# Patient Record
Sex: Female | Born: 1975 | Race: White | Hispanic: No | Marital: Married | Smoking: Former smoker
Health system: Southern US, Community
[De-identification: ages and names within clinical notes are randomized; demographics above are authoritative.]

## PROBLEM LIST (undated history)

## (undated) DIAGNOSIS — J45909 Unspecified asthma, uncomplicated: Secondary | ICD-10-CM

## (undated) DIAGNOSIS — E669 Obesity, unspecified: Secondary | ICD-10-CM

## (undated) DIAGNOSIS — I10 Essential (primary) hypertension: Secondary | ICD-10-CM

## (undated) DIAGNOSIS — F419 Anxiety disorder, unspecified: Secondary | ICD-10-CM

## (undated) DIAGNOSIS — J302 Other seasonal allergic rhinitis: Secondary | ICD-10-CM

## (undated) DIAGNOSIS — E119 Type 2 diabetes mellitus without complications: Secondary | ICD-10-CM

## (undated) DIAGNOSIS — K603 Anal fistula: Secondary | ICD-10-CM

## (undated) DIAGNOSIS — F319 Bipolar disorder, unspecified: Secondary | ICD-10-CM

## (undated) DIAGNOSIS — K649 Unspecified hemorrhoids: Secondary | ICD-10-CM

## (undated) DIAGNOSIS — M549 Dorsalgia, unspecified: Secondary | ICD-10-CM

## (undated) DIAGNOSIS — G8929 Other chronic pain: Secondary | ICD-10-CM

## (undated) DIAGNOSIS — M7711 Lateral epicondylitis, right elbow: Secondary | ICD-10-CM

## (undated) DIAGNOSIS — E559 Vitamin D deficiency, unspecified: Secondary | ICD-10-CM

## (undated) HISTORY — PX: WISDOM TOOTH EXTRACTION: SHX21

---

## 2016-12-07 DIAGNOSIS — E119 Type 2 diabetes mellitus without complications: Secondary | ICD-10-CM | POA: Diagnosis not present

## 2016-12-07 DIAGNOSIS — Z7984 Long term (current) use of oral hypoglycemic drugs: Secondary | ICD-10-CM | POA: Diagnosis not present

## 2017-06-25 DIAGNOSIS — IMO0001 Reserved for inherently not codable concepts without codable children: Secondary | ICD-10-CM | POA: Insufficient documentation

## 2017-06-25 DIAGNOSIS — I10 Essential (primary) hypertension: Secondary | ICD-10-CM | POA: Insufficient documentation

## 2017-06-25 DIAGNOSIS — E7800 Pure hypercholesterolemia, unspecified: Secondary | ICD-10-CM | POA: Insufficient documentation

## 2017-06-25 DIAGNOSIS — E559 Vitamin D deficiency, unspecified: Secondary | ICD-10-CM | POA: Insufficient documentation

## 2017-06-25 DIAGNOSIS — E1165 Type 2 diabetes mellitus with hyperglycemia: Secondary | ICD-10-CM | POA: Insufficient documentation

## 2017-10-29 DIAGNOSIS — I10 Essential (primary) hypertension: Secondary | ICD-10-CM | POA: Diagnosis not present

## 2017-10-29 DIAGNOSIS — N926 Irregular menstruation, unspecified: Secondary | ICD-10-CM | POA: Diagnosis not present

## 2017-10-29 DIAGNOSIS — E119 Type 2 diabetes mellitus without complications: Secondary | ICD-10-CM | POA: Diagnosis not present

## 2017-10-29 DIAGNOSIS — Z23 Encounter for immunization: Secondary | ICD-10-CM | POA: Diagnosis not present

## 2017-11-03 DIAGNOSIS — T881XXA Other complications following immunization, not elsewhere classified, initial encounter: Secondary | ICD-10-CM | POA: Diagnosis not present

## 2017-11-03 DIAGNOSIS — M79622 Pain in left upper arm: Secondary | ICD-10-CM | POA: Diagnosis not present

## 2017-11-03 DIAGNOSIS — I10 Essential (primary) hypertension: Secondary | ICD-10-CM | POA: Diagnosis not present

## 2017-11-09 HISTORY — PX: FISTULOTOMY: SHX6413

## 2017-11-13 DIAGNOSIS — K603 Anal fistula: Secondary | ICD-10-CM | POA: Diagnosis not present

## 2017-11-15 ENCOUNTER — Encounter (HOSPITAL_BASED_OUTPATIENT_CLINIC_OR_DEPARTMENT_OTHER): Payer: Self-pay | Admitting: *Deleted

## 2017-11-15 ENCOUNTER — Ambulatory Visit: Payer: Self-pay | Admitting: General Surgery

## 2017-11-15 ENCOUNTER — Other Ambulatory Visit: Payer: Self-pay

## 2017-11-15 NOTE — Progress Notes (Signed)
Npo after midnight arrive 630am 11-28-17 [facility] take buspirone, concerta, celexa, hydroxazine, ventolina inhaler prn and bring inhaler, spouse brad driver needs urine pregnancy, spoke with dr Molly Madurorobert fitzgerald anesthesia and patient needs to come in for airway assessment due to bmi greater than 45, patient made appointment for labs and ekg 11-23-17 0900 am.

## 2017-11-23 ENCOUNTER — Encounter (HOSPITAL_COMMUNITY)
Admission: RE | Admit: 2017-11-23 | Discharge: 2017-11-23 | Disposition: A | Discharge status: Home / Self Care | Payer: 59 | Source: Ambulatory Visit | Attending: General Surgery | Admitting: General Surgery

## 2017-11-23 ENCOUNTER — Other Ambulatory Visit: Payer: Self-pay

## 2017-11-23 DIAGNOSIS — Z01818 Encounter for other preprocedural examination: Secondary | ICD-10-CM | POA: Insufficient documentation

## 2017-11-23 DIAGNOSIS — I10 Essential (primary) hypertension: Secondary | ICD-10-CM | POA: Diagnosis not present

## 2017-11-23 DIAGNOSIS — E119 Type 2 diabetes mellitus without complications: Secondary | ICD-10-CM | POA: Diagnosis not present

## 2017-11-23 DIAGNOSIS — R9431 Abnormal electrocardiogram [ECG] [EKG]: Secondary | ICD-10-CM | POA: Diagnosis not present

## 2017-11-23 LAB — BASIC METABOLIC PANEL
Anion gap: 9 (ref 5–15)
BUN: 21 mg/dL — AB (ref 6–20)
CALCIUM: 8.7 mg/dL — AB (ref 8.9–10.3)
CO2: 24 mmol/L (ref 22–32)
Chloride: 104 mmol/L (ref 101–111)
Creatinine, Ser: 0.71 mg/dL (ref 0.44–1.00)
GFR calc Af Amer: >60 mL/min (ref >60)
GLUCOSE: 210 mg/dL — AB (ref 65–99)
POTASSIUM: 4.3 mmol/L (ref 3.5–5.1)
Sodium: 137 mmol/L (ref 135–145)

## 2017-11-23 LAB — CBC
HCT: 40.2 % (ref 36.0–46.0)
Hemoglobin: 13.3 g/dL (ref 12.0–15.0)
MCH: 29.4 pg (ref 26.0–34.0)
MCHC: 33.1 g/dL (ref 30.0–36.0)
MCV: 88.7 fL (ref 78.0–100.0)
PLATELETS: 261 10*3/uL (ref 150–400)
RBC: 4.53 MIL/uL (ref 3.87–5.11)
RDW: 14.2 % (ref 11.5–15.5)
WBC: 7.9 10*3/uL (ref 4.0–10.5)

## 2017-11-28 ENCOUNTER — Ambulatory Visit (HOSPITAL_BASED_OUTPATIENT_CLINIC_OR_DEPARTMENT_OTHER): Payer: 59 | Admitting: Anesthesiology

## 2017-11-28 ENCOUNTER — Ambulatory Visit (HOSPITAL_BASED_OUTPATIENT_CLINIC_OR_DEPARTMENT_OTHER)
Admission: RE | Admit: 2017-11-28 | Discharge: 2017-11-28 | Disposition: A | Discharge status: Home / Self Care | Payer: 59 | Source: Ambulatory Visit | Attending: General Surgery | Admitting: General Surgery

## 2017-11-28 ENCOUNTER — Encounter (HOSPITAL_BASED_OUTPATIENT_CLINIC_OR_DEPARTMENT_OTHER)
Admission: RE | Disposition: A | Discharge status: Home / Self Care | Payer: Self-pay | Source: Ambulatory Visit | Attending: General Surgery

## 2017-11-28 ENCOUNTER — Encounter (HOSPITAL_BASED_OUTPATIENT_CLINIC_OR_DEPARTMENT_OTHER): Payer: Self-pay

## 2017-11-28 DIAGNOSIS — K603 Anal fistula: Secondary | ICD-10-CM | POA: Insufficient documentation

## 2017-11-28 DIAGNOSIS — J45909 Unspecified asthma, uncomplicated: Secondary | ICD-10-CM | POA: Diagnosis not present

## 2017-11-28 DIAGNOSIS — I10 Essential (primary) hypertension: Secondary | ICD-10-CM | POA: Insufficient documentation

## 2017-11-28 DIAGNOSIS — E119 Type 2 diabetes mellitus without complications: Secondary | ICD-10-CM | POA: Insufficient documentation

## 2017-11-28 DIAGNOSIS — F319 Bipolar disorder, unspecified: Secondary | ICD-10-CM | POA: Diagnosis not present

## 2017-11-28 DIAGNOSIS — E7800 Pure hypercholesterolemia, unspecified: Secondary | ICD-10-CM | POA: Diagnosis not present

## 2017-11-28 DIAGNOSIS — Z7984 Long term (current) use of oral hypoglycemic drugs: Secondary | ICD-10-CM | POA: Insufficient documentation

## 2017-11-28 DIAGNOSIS — F419 Anxiety disorder, unspecified: Secondary | ICD-10-CM | POA: Diagnosis not present

## 2017-11-28 DIAGNOSIS — M549 Dorsalgia, unspecified: Secondary | ICD-10-CM | POA: Diagnosis not present

## 2017-11-28 DIAGNOSIS — E669 Obesity, unspecified: Secondary | ICD-10-CM | POA: Insufficient documentation

## 2017-11-28 DIAGNOSIS — Z87891 Personal history of nicotine dependence: Secondary | ICD-10-CM | POA: Diagnosis not present

## 2017-11-28 DIAGNOSIS — K649 Unspecified hemorrhoids: Secondary | ICD-10-CM | POA: Diagnosis not present

## 2017-11-28 DIAGNOSIS — Z79899 Other long term (current) drug therapy: Secondary | ICD-10-CM | POA: Diagnosis not present

## 2017-11-28 DIAGNOSIS — Z6841 Body Mass Index (BMI) 40.0 and over, adult: Secondary | ICD-10-CM | POA: Diagnosis not present

## 2017-11-28 HISTORY — DX: Essential (primary) hypertension: I10

## 2017-11-28 HISTORY — DX: Other chronic pain: G89.29

## 2017-11-28 HISTORY — DX: Anxiety disorder, unspecified: F41.9

## 2017-11-28 HISTORY — DX: Type 2 diabetes mellitus without complications: E11.9

## 2017-11-28 HISTORY — DX: Unspecified asthma, uncomplicated: J45.909

## 2017-11-28 HISTORY — DX: Dorsalgia, unspecified: M54.9

## 2017-11-28 HISTORY — DX: Bipolar disorder, unspecified: F31.9

## 2017-11-28 LAB — GLUCOSE, CAPILLARY
GLUCOSE-CAPILLARY: 201 mg/dL — AB (ref 65–99)
Glucose-Capillary: 175 mg/dL — ABNORMAL HIGH (ref 65–99)

## 2017-11-28 LAB — POCT PREGNANCY, URINE: PREG TEST UR: NEGATIVE

## 2017-11-28 SURGERY — EXAM UNDER ANESTHESIA
Anesthesia: Monitor Anesthesia Care | Site: Rectum

## 2017-11-28 MED ORDER — ONDANSETRON HCL 4 MG/2ML IJ SOLN
INTRAMUSCULAR | Status: DC | PRN
  Administered 2017-11-28: 4 mg via INTRAVENOUS

## 2017-11-28 MED ORDER — ONDANSETRON HCL 4 MG/2ML IJ SOLN
INTRAMUSCULAR | Status: AC
  Filled 2017-11-28: qty 2

## 2017-11-28 MED ORDER — PROPOFOL 10 MG/ML IV BOLUS
INTRAVENOUS | Status: AC
  Filled 2017-11-28: qty 20

## 2017-11-28 MED ORDER — DEXAMETHASONE SODIUM PHOSPHATE 10 MG/ML IJ SOLN
INTRAMUSCULAR | Status: AC
  Filled 2017-11-28: qty 1

## 2017-11-28 MED ORDER — ACETAMINOPHEN 325 MG PO TABS
650.0000 mg | ORAL_TABLET | ORAL | Status: DC | PRN
  Filled 2017-11-28: qty 2

## 2017-11-28 MED ORDER — LIDOCAINE 2% (20 MG/ML) 5 ML SYRINGE
INTRAMUSCULAR | Status: AC
  Filled 2017-11-28: qty 5

## 2017-11-28 MED ORDER — CELECOXIB 200 MG PO CAPS
ORAL_CAPSULE | ORAL | Status: AC
  Filled 2017-11-28: qty 1

## 2017-11-28 MED ORDER — OXYCODONE HCL 5 MG PO TABS
ORAL_TABLET | ORAL | Status: AC
  Filled 2017-11-28: qty 1

## 2017-11-28 MED ORDER — PROMETHAZINE HCL 25 MG/ML IJ SOLN
6.2500 mg | INTRAMUSCULAR | Status: DC | PRN
  Filled 2017-11-28: qty 1

## 2017-11-28 MED ORDER — ACETAMINOPHEN 650 MG RE SUPP
650.0000 mg | RECTAL | Status: DC | PRN
  Filled 2017-11-28: qty 1

## 2017-11-28 MED ORDER — SODIUM CHLORIDE 0.9% FLUSH
3.0000 mL | INTRAVENOUS | Status: DC | PRN
  Filled 2017-11-28: qty 3

## 2017-11-28 MED ORDER — MIDAZOLAM HCL 2 MG/2ML IJ SOLN
INTRAMUSCULAR | Status: AC
  Filled 2017-11-28: qty 2

## 2017-11-28 MED ORDER — SODIUM CHLORIDE 0.9% FLUSH
3.0000 mL | Freq: Two times a day (BID) | INTRAVENOUS | Status: DC
  Filled 2017-11-28: qty 3

## 2017-11-28 MED ORDER — DEXAMETHASONE SODIUM PHOSPHATE 4 MG/ML IJ SOLN
INTRAMUSCULAR | Status: DC | PRN
  Administered 2017-11-28: 5 mg via INTRAVENOUS

## 2017-11-28 MED ORDER — ACETAMINOPHEN 500 MG PO TABS
ORAL_TABLET | ORAL | Status: AC
  Filled 2017-11-28: qty 2

## 2017-11-28 MED ORDER — MEPERIDINE HCL 25 MG/ML IJ SOLN
6.2500 mg | INTRAMUSCULAR | Status: DC | PRN
  Filled 2017-11-28: qty 1

## 2017-11-28 MED ORDER — GABAPENTIN 300 MG PO CAPS
ORAL_CAPSULE | ORAL | Status: AC
  Filled 2017-11-28: qty 1

## 2017-11-28 MED ORDER — FENTANYL CITRATE (PF) 100 MCG/2ML IJ SOLN
25.0000 ug | INTRAMUSCULAR | Status: DC | PRN
  Filled 2017-11-28: qty 1

## 2017-11-28 MED ORDER — BUPIVACAINE-EPINEPHRINE 0.5% -1:200000 IJ SOLN
INTRAMUSCULAR | Status: DC | PRN
  Administered 2017-11-28: 30 mL

## 2017-11-28 MED ORDER — SODIUM CHLORIDE 0.9 % IV SOLN
250.0000 mL | INTRAVENOUS | Status: DC | PRN
  Filled 2017-11-28: qty 250

## 2017-11-28 MED ORDER — PROPOFOL 10 MG/ML IV BOLUS
INTRAVENOUS | Status: DC | PRN
  Administered 2017-11-28: 20 mg via INTRAVENOUS

## 2017-11-28 MED ORDER — LACTATED RINGERS IV SOLN
INTRAVENOUS | Status: DC
  Filled 2017-11-28: qty 1000

## 2017-11-28 MED ORDER — KETAMINE HCL 50 MG/ML IJ SOLN
INTRAMUSCULAR | Status: DC | PRN
  Administered 2017-11-28 (×5): 10 mg via INTRAMUSCULAR

## 2017-11-28 MED ORDER — KETAMINE HCL 10 MG/ML IJ SOLN
INTRAMUSCULAR | Status: AC
  Filled 2017-11-28: qty 1

## 2017-11-28 MED ORDER — PROPOFOL 500 MG/50ML IV EMUL
INTRAVENOUS | Status: DC | PRN
  Administered 2017-11-28: 200 ug/kg/min via INTRAVENOUS

## 2017-11-28 MED ORDER — HYDROCODONE-ACETAMINOPHEN 5-325 MG PO TABS: 1.0000 | ORAL_TABLET | ORAL | 0 refills | Status: DC | PRN

## 2017-11-28 MED ORDER — FENTANYL CITRATE (PF) 100 MCG/2ML IJ SOLN
INTRAMUSCULAR | Status: AC
  Filled 2017-11-28: qty 2

## 2017-11-28 MED ORDER — LIDOCAINE HCL (CARDIAC) 20 MG/ML IV SOLN
INTRAVENOUS | Status: DC | PRN
  Administered 2017-11-28: 100 mg via INTRAVENOUS
  Administered 2017-11-28: 1 mg via INTRAVENOUS

## 2017-11-28 MED ORDER — CELECOXIB 200 MG PO CAPS
200.0000 mg | ORAL_CAPSULE | ORAL | Status: AC
  Administered 2017-11-28: 200 mg via ORAL
  Filled 2017-11-28: qty 1

## 2017-11-28 MED ORDER — MIDAZOLAM HCL 5 MG/5ML IJ SOLN
INTRAMUSCULAR | Status: DC | PRN
  Administered 2017-11-28: 2 mg via INTRAVENOUS

## 2017-11-28 MED ORDER — ARTIFICIAL TEARS OPHTHALMIC OINT
TOPICAL_OINTMENT | OPHTHALMIC | Status: AC
  Filled 2017-11-28: qty 3.5

## 2017-11-28 MED ORDER — OXYCODONE HCL 5 MG PO TABS
5.0000 mg | ORAL_TABLET | Freq: Once | ORAL | Status: AC | PRN
  Administered 2017-11-28: 5 mg via ORAL
  Filled 2017-11-28: qty 1

## 2017-11-28 MED ORDER — ACETAMINOPHEN 500 MG PO TABS
1000.0000 mg | ORAL_TABLET | ORAL | Status: AC
  Administered 2017-11-28: 1000 mg via ORAL
  Filled 2017-11-28: qty 2

## 2017-11-28 MED ORDER — LACTATED RINGERS IV SOLN
INTRAVENOUS | Status: DC
  Administered 2017-11-28: 07:00:00 via INTRAVENOUS
  Administered 2017-11-28: 1000 mL via INTRAVENOUS
  Filled 2017-11-28: qty 1000

## 2017-11-28 MED ORDER — PROPOFOL 500 MG/50ML IV EMUL
INTRAVENOUS | Status: AC
  Filled 2017-11-28: qty 50

## 2017-11-28 MED ORDER — OXYCODONE HCL 5 MG PO TABS
5.0000 mg | ORAL_TABLET | ORAL | Status: DC | PRN
  Filled 2017-11-28: qty 2

## 2017-11-28 MED ORDER — GABAPENTIN 300 MG PO CAPS
300.0000 mg | ORAL_CAPSULE | ORAL | Status: AC
  Administered 2017-11-28: 300 mg via ORAL
  Filled 2017-11-28: qty 1

## 2017-11-28 MED ORDER — SUCCINYLCHOLINE CHLORIDE 200 MG/10ML IV SOSY
PREFILLED_SYRINGE | INTRAVENOUS | Status: AC
  Filled 2017-11-28: qty 10

## 2017-11-28 MED ORDER — OXYCODONE HCL 5 MG/5ML PO SOLN
5.0000 mg | Freq: Once | ORAL | Status: AC | PRN
  Filled 2017-11-28: qty 5

## 2017-11-28 SURGICAL SUPPLY — 56 items
BENZOIN TINCTURE PRP APPL 2/3 (GAUZE/BANDAGES/DRESSINGS) ×6 IMPLANT
BLADE EXTENDED COATED 6.5IN (ELECTRODE) IMPLANT
BLADE HEX COATED 2.75 (ELECTRODE) ×3 IMPLANT
BLADE SURG 10 STRL SS (BLADE) IMPLANT
BLADE SURG 15 STRL LF DISP TIS (BLADE) IMPLANT
BLADE SURG 15 STRL SS (BLADE)
BRIEF STRETCH FOR OB PAD LRG (UNDERPADS AND DIAPERS) ×3 IMPLANT
CANISTER SUCT 3000ML PPV (MISCELLANEOUS) ×3 IMPLANT
COVER BACK TABLE 60X90IN (DRAPES) ×3 IMPLANT
COVER MAYO STAND STRL (DRAPES) ×3 IMPLANT
DECANTER SPIKE VIAL GLASS SM (MISCELLANEOUS) ×3 IMPLANT
DRAPE LAPAROTOMY 100X72 PEDS (DRAPES) ×3 IMPLANT
DRAPE UTILITY XL STRL (DRAPES) ×3 IMPLANT
ELECT REM PT RETURN 9FT ADLT (ELECTROSURGICAL) ×3
ELECTRODE REM PT RTRN 9FT ADLT (ELECTROSURGICAL) ×2 IMPLANT
GAUZE SPONGE 4X4 12PLY STRL (GAUZE/BANDAGES/DRESSINGS) ×3 IMPLANT
GAUZE SPONGE 4X4 16PLY XRAY LF (GAUZE/BANDAGES/DRESSINGS) IMPLANT
GLOVE BIO SURGEON STRL SZ 6.5 (GLOVE) ×3 IMPLANT
GLOVE BIOGEL PI IND STRL 7.0 (GLOVE) ×2 IMPLANT
GLOVE BIOGEL PI IND STRL 7.5 (GLOVE) ×2 IMPLANT
GLOVE BIOGEL PI IND STRL 8.5 (GLOVE) ×2 IMPLANT
GLOVE BIOGEL PI INDICATOR 7.0 (GLOVE) ×1
GLOVE BIOGEL PI INDICATOR 7.5 (GLOVE) ×1
GLOVE BIOGEL PI INDICATOR 8.5 (GLOVE) ×1
GLOVE INDICATOR 7.0 STRL GRN (GLOVE) ×3 IMPLANT
GLOVE SURG SS PI 8.5 STRL IVOR (GLOVE) ×1
GLOVE SURG SS PI 8.5 STRL STRW (GLOVE) ×2 IMPLANT
GOWN SPEC L3 XXLG W/TWL (GOWN DISPOSABLE) ×3 IMPLANT
GOWN STRL REUS W/TWL 2XL LVL3 (GOWN DISPOSABLE) ×3 IMPLANT
HYDROGEN PEROXIDE 16OZ (MISCELLANEOUS) ×3 IMPLANT
KIT RM TURNOVER CYSTO AR (KITS) ×3 IMPLANT
LOOP VESSEL MAXI BLUE (MISCELLANEOUS) ×3 IMPLANT
NEEDLE HYPO 22GX1.5 SAFETY (NEEDLE) ×3 IMPLANT
NS IRRIG 500ML POUR BTL (IV SOLUTION) ×3 IMPLANT
PACK BASIN DAY SURGERY FS (CUSTOM PROCEDURE TRAY) ×3 IMPLANT
PAD ABD 8X10 STRL (GAUZE/BANDAGES/DRESSINGS) ×3 IMPLANT
PAD ARMBOARD 7.5X6 YLW CONV (MISCELLANEOUS) ×3 IMPLANT
PENCIL BUTTON HOLSTER BLD 10FT (ELECTRODE) ×3 IMPLANT
SPONGE GAUZE 4X4 12PLY STER LF (GAUZE/BANDAGES/DRESSINGS) IMPLANT
SPONGE HEMORRHOID 8X3CM (HEMOSTASIS) IMPLANT
SPONGE SURGIFOAM ABS GEL 12-7 (HEMOSTASIS) IMPLANT
SUCTION FRAZIER HANDLE 10FR (MISCELLANEOUS)
SUCTION TUBE FRAZIER 10FR DISP (MISCELLANEOUS) IMPLANT
SUT CHROMIC 2 0 SH (SUTURE) ×3 IMPLANT
SUT CHROMIC 3 0 SH 27 (SUTURE) ×3 IMPLANT
SUT ETHIBOND 0 (SUTURE) ×3 IMPLANT
SUT VIC AB 2-0 SH 27 (SUTURE)
SUT VIC AB 2-0 SH 27XBRD (SUTURE) IMPLANT
SUT VIC AB 3-0 SH 18 (SUTURE) IMPLANT
SUT VIC AB 4-0 P-3 18XBRD (SUTURE) IMPLANT
SUT VIC AB 4-0 P3 18 (SUTURE)
SYR CONTROL 10ML LL (SYRINGE) ×3 IMPLANT
TOWEL OR 17X24 6PK STRL BLUE (TOWEL DISPOSABLE) ×3 IMPLANT
TRAY DSU PREP LF (CUSTOM PROCEDURE TRAY) ×3 IMPLANT
TUBE CONNECTING 12X1/4 (SUCTIONS) ×3 IMPLANT
YANKAUER SUCT BULB TIP NO VENT (SUCTIONS) ×3 IMPLANT

## 2017-11-28 NOTE — Anesthesia Procedure Notes (Signed)
Procedure Name: [place] Performed by: Effie Berkshire, MD Pre-anesthesia Checklist: Patient identified, Timeout performed, Emergency Drugs available, Suction available and Patient being monitored Placement Confirmation: positive ETCO2,  CO2 detector and breath sounds checked- equal and bilateral

## 2017-11-28 NOTE — Anesthesia Preprocedure Evaluation (Addendum)
Anesthesia Evaluation  Patient identified by MRN, date of birth, ID band Patient awake    Reviewed: Allergy & Precautions, NPO status , Patient's Chart, lab work & pertinent test results  Airway Mallampati: I  TM Distance: >3 FB Neck ROM: Full    Dental  (+) Teeth Intact, Dental Advisory Given   Pulmonary asthma , former smoker,    breath sounds clear to auscultation       Cardiovascular hypertension, Pt. on medications  Rhythm:Regular Rate:Normal     Neuro/Psych PSYCHIATRIC DISORDERS Anxiety Bipolar Disorder    GI/Hepatic negative GI ROS, Neg liver ROS,   Endo/Other  diabetes, Type 2, Oral Hypoglycemic Agents  Renal/GU negative Renal ROS  negative genitourinary   Musculoskeletal negative musculoskeletal ROS (+)   Abdominal (+) + obese,   Peds  Hematology   Anesthesia Other Findings   Reproductive/Obstetrics                            Lab Results  Component Value Date   WBC 7.9 11/23/2017   HGB 13.3 11/23/2017   HCT 40.2 11/23/2017   MCV 88.7 11/23/2017   PLT 261 11/23/2017   Lab Results  Component Value Date   CREATININE 0.71 11/23/2017   BUN 21 (H) 11/23/2017   NA 137 11/23/2017   K 4.3 11/23/2017   CL 104 11/23/2017   CO2 24 11/23/2017   No results found for: INR, PROTIME  EKG: normal sinus rhythm.  Anesthesia Physical Anesthesia Plan  ASA: III  Anesthesia Plan: MAC   Post-op Pain Management:    Induction: Intravenous  PONV Risk Score and Plan: 3 and Ondansetron, Propofol infusion and Midazolam  Airway Management Planned: Natural Airway and Simple Face Mask  Additional Equipment: None  Intra-op Plan:   Post-operative Plan:   Informed Consent: I have reviewed the patients History and Physical, chart, labs and discussed the procedure including the risks, benefits and alternatives for the proposed anesthesia with the patient or authorized representative who has  indicated his/her understanding and acceptance.   Dental advisory given  Plan Discussed with: CRNA  Anesthesia Plan Comments:         Anesthesia Quick Evaluation

## 2017-11-28 NOTE — Transfer of Care (Signed)
  Last Vitals:  Vitals:   11/28/17 0913 11/28/17 0915  BP: 126/82 124/78  Pulse: 88 85  Resp: 12 12  Temp: 36.9 C   SpO2: 99% 100%    Last Pain:  Vitals:   11/28/17 0646  TempSrc:   PainSc: 6       Patients Stated Pain Goal: 5 (11/28/17 1747)  Immediate Anesthesia Transfer of Care Note  Patient: Tanya Parker  Procedure(s) Performed: Procedure(s) (LRB): ANAL EXAM UNDER ANESTHESIA WITH SETON placement (N/A)  Patient Location: PACU  Anesthesia Type: MAC  Level of Consciousness: awake, alert  and oriented  Airway & Oxygen Therapy: Patient Spontanous Breathing and Patient connected to oxygen  Post-op Assessment: Report given to PACU RN and Post -op Vital signs reviewed and stable  Post vital signs: Reviewed and stable  Complications: No apparent anesthesia complications

## 2017-11-28 NOTE — Op Note (Addendum)
11/28/2017  9:06 AM  PATIENT:  Tanya Parker  42 y.o. female  Patient Care Team: London Pepper, MD as PCP - General (Family Medicine)  PRE-OPERATIVE DIAGNOSIS:  anal fistula  POST-OPERATIVE DIAGNOSIS:  anal fistula  PROCEDURE:  ANAL EXAM UNDER ANESTHESIA WITH SETON PLACEMENT   Surgeon(s): Leighton Ruff, MD  ASSISTANT: none   ANESTHESIA:   local and MAC  SPECIMEN:  No Specimen  DISPOSITION OF SPECIMEN:  N/A  COUNTS:  YES  PLAN OF CARE: Discharge to home after PACU  PATIENT DISPOSITION:  PACU - hemodynamically stable.  INDICATION: 42 year old female who presented to the office with complaints of a perianal abscess and chronic perianal drainage.   OR FINDINGS: Right anterior fistula with external sphincter involvement  DESCRIPTION: the patient was identified in the preoperative holding area and taken to the OR where they were laid on the operating room table.  MAC anesthesia was induced without difficulty. The patient was then positioned in prone jackknife position with buttocks gently taped apart.  The patient was then prepped and draped in usual sterile fashion.  SCDs were noted to be in place prior to the initiation of anesthesia. A surgical timeout was performed indicating the correct patient, procedure, positioning and need for preoperative antibiotics.  A rectal block was performed using Marcaine with epinephrine.    I began with a digital rectal exam.  There were no masses.  Sphincter tone was mildly decreased.  I then placed a Hill-Ferguson anoscope into the anal canal and evaluated this completely.  There was an obvious anterior midline internal opening distal to the dentate line.  I divided the fistula tract to the level of the external sphincter using Bovie electrocautery.  This was marsupialized with a 2-0 chromic suture.  I then passed an Ethibond suture through the fistula tract and brought a vessel loop back through to create a seton.  I used Ethibond suture to  secure the seton into a loop.  Additional local anesthesia was given.  A dressing was applied.  The patient was awakened from anesthesia and sent to the postanesthesia care unit in stable condition.  I have reviewed the [employer].

## 2017-11-28 NOTE — Discharge Instructions (Addendum)
ANORECTAL SURGERY: POST OP INSTRUCTIONS 1. Take your usually prescribed home medications unless otherwise directed. 2. DIET: During the first few hours after surgery sip on some liquids until you are able to urinate.  It is normal to not urinate for several hours after this surgery.  If you feel uncomfortable, please contact the office for instructions.  After you are able to urinate,you may eat, if you feel like it.  Follow a light bland diet the first 24 hours after arrival home, such as soup, liquids, crackers, etc.  Be sure to include lots of fluids daily (6-8 glasses).  Avoid fast food or heavy meals, as your are more likely to get nauseated.  Eat a low fat diet the next few days after surgery.  Limit caffeine intake to 1-2 servings a day. 3. PAIN CONTROL: a. Pain is best controlled by a usual combination of several different methods TOGETHER: i. Muscle relaxation: Soak in a warm bath (or Sitz bath) three times a day and after bowel movements.  Continue to do this until all pain is resolved. ii. Over the counter pain medication iii. Prescription pain medication b. Most patients will experience some swelling and discomfort in the anus/rectal area and incisions.  Heat such as warm towels, sitz baths, warm baths, etc to help relax tight/sore spots and speed recovery.  Some people prefer to use ice, especially in the first couple days after surgery, as it may decrease the pain and swelling, or alternate between ice & heat.  Experiment to what works for you.  Swelling and bruising can take several weeks to resolve.  Pain can take even longer to completely resolve. c. It is helpful to take an over-the-counter pain medication regularly for the first few weeks.  Choose one of the following that works best for you: i. Naproxen (Aleve, etc)  Two 220mg tabs twice a day ii. Ibuprofen (Advil, etc) Three 200mg tabs four times a day (every meal & bedtime) d. A  prescription for pain medication (such as percocet,  oxycodone, hydrocodone, etc) should be given to you upon discharge.  Take your pain medication as prescribed.  i. If you are having problems/concerns with the prescription medicine (does not control pain, nausea, vomiting, rash, itching, etc), please call us [phone] to see if we need to switch you to a different pain medicine that will work better for you and/or control your side effect better. ii. If you need a refill on your pain medication, please contact your pharmacy.  They will contact our office to request authorization. Prescriptions will not be filled after 5 pm or on week-ends. 4. KEEP YOUR BOWELS REGULAR and AVOID CONSTIPATION a. The goal is one to two soft bowel movements a day.  You should at least have a bowel movement every other day. b. Avoid getting constipated.  Between the surgery and the pain medications, it is common to experience some constipation. This can be very painful after rectal surgery.  Increasing fluid intake and taking a fiber supplement (such as Metamucil, Citrucel, FiberCon, etc) 1-2 times a day regularly will usually help prevent this problem from occurring.  A stool softener like colace is also recommended.  This can be purchased over the counter at your pharmacy.  You can take it up to 3 times a day.  If you do not have a bowel movement after 24 hrs since your surgery, take one does of milk of magnesia.  If you still haven't had a bowel movement 8-12 hours after   that dose, take another dose.  If you don't have a bowel movement 48 hrs after surgery, purchase a Fleets enema from the drug store and administer gently per package instructions.  If you still are having trouble with your bowel movements after that, please call the office for further instructions. c. If you develop diarrhea or have many loose bowel movements, simplify your diet to bland foods & liquids for a few days.  Stop any stool softeners and decrease your fiber supplement.  Switching to mild  anti-diarrheal medications (Kayopectate, Pepto Bismol) can help.  If this worsens or does not improve, please call us.  5. Wound Care a. Remove your bandages before your first bowel movement or 8 hours after surgery.     b. Remove any wound packing material at this tim,e as well.  You do not need to repack the wound unless instructed otherwise.  Wear an absorbent pad or soft cotton gauze in your underwear to catch any drainage and help keep the area clean. You should change this every 2-3 hours while awake. c. Keep the area clean and dry.  Bathe / shower every day, especially after bowel movements.  Keep the area clean by showering / bathing over the incision / wound.   It is okay to soak an open wound to help wash it.  Wet wipes or showers / gentle washing after bowel movements is often less traumatic than regular toilet paper. d. You may have some styrofoam-like soft packing in the rectum which will come out with the first bowel movement.  e. You will often notice bleeding with bowel movements.  This should slow down by the end of the first week of surgery f. Expect some drainage.  This should slow down, too, by the end of the first week of surgery.  Wear an absorbent pad or soft cotton gauze in your underwear until the drainage stops. g. Do Not sit on a rubber or pillow ring.  This can make you symptoms worse.  You may sit on a soft pillow if needed.  6. ACTIVITIES as tolerated:   a. You may resume regular (light) daily activities beginning the next day--such as daily self-care, walking, climbing stairs--gradually increasing activities as tolerated.  If you can walk 30 minutes without difficulty, it is safe to try more intense activity such as jogging, treadmill, bicycling, low-impact aerobics, swimming, etc. b. Save the most intensive and strenuous activity for last such as sit-ups, heavy lifting, contact sports, etc  Refrain from any heavy lifting or straining until you are off narcotics for pain  control.   c. You may drive when you are no longer taking prescription pain medication, you can comfortably sit for long periods of time, and you can safely maneuver your car and apply brakes. d. You may have sexual intercourse when it is comfortable.  7. FOLLOW UP in our office a. Please call [facility] at [phone] to set up an appointment to see your surgeon in the office for a follow-up appointment approximately 3-4 weeks after your surgery. b. Make sure that you call for this appointment the day you arrive home to insure a convenient appointment time. 10. IF YOU HAVE DISABILITY OR FAMILY LEAVE FORMS, BRING THEM TO THE OFFICE FOR PROCESSING.  DO NOT GIVE THEM TO YOUR DOCTOR.     WHEN TO CALL US [phone]: 1. Poor pain control 2. Reactions / problems with new medications (rash/itching, nausea, etc)  3. Fever over 101.5 F (38.5 C) 4.   Inability to urinate 5. Nausea and/or vomiting 6. Worsening swelling or bruising 7. Continued bleeding from incision. 8. Increased pain, redness, or drainage from the incision  The clinic staff is available to answer your questions during regular business hours (8:30am-5pm).  Please don't hesitate to call and ask to speak to one of our nurses for clinical concerns.   A surgeon from [facility] is always on call at the hospitals   If you have a medical emergency, go to the nearest emergency room or call 911.    [facility] [address], [address], [place], [place]  [address] ? MAIN: [phone] ? TOLL FREE: [phone] ? FAX [phone] [email or web address]     Post Anesthesia Home Care Instructions  Activity: Get plenty of rest for the remainder of the day. A responsible individual must stay with you for 24 hours following the procedure.  For the next 24 hours, DO NOT: -Drive a car -Operate machinery -Drink alcoholic beverages -Take any medication unless instructed by your  physician -Make any legal decisions or sign important papers.  Meals: Start with liquid foods such as gelatin or soup. Progress to regular foods as tolerated. Avoid greasy, spicy, heavy foods. If nausea and/or vomiting occur, drink only clear liquids until the nausea and/or vomiting subsides. Call your physician if vomiting continues.  Special Instructions/Symptoms: Your throat may feel dry or sore from the anesthesia or the breathing tube placed in your throat during surgery. If this causes discomfort, gargle with warm salt water. The discomfort should disappear within 24 hours.  If you had a scopolamine patch placed behind your ear for the management of post- operative nausea and/or vomiting:  1. The medication in the patch is effective for 72 hours, after which it should be removed.  Wrap patch in a tissue and discard in the trash. Wash hands thoroughly with soap and water. 2. You may remove the patch earlier than 72 hours if you experience unpleasant side effects which may include dry mouth, dizziness or visual disturbances. 3. Avoid touching the patch. Wash your hands with soap and water after contact with the patch.        

## 2017-11-28 NOTE — Anesthesia Postprocedure Evaluation (Signed)
Anesthesia Post Note  Patient: Tanya Parker  Procedure(s) Performed: ANAL EXAM UNDER ANESTHESIA WITH SETON placement (N/A Rectum)     Patient location during evaluation: PACU Anesthesia Type: MAC Level of consciousness: awake and alert Pain management: pain level controlled Vital Signs Assessment: post-procedure vital signs reviewed and stable Respiratory status: spontaneous breathing, nonlabored ventilation, respiratory function stable and patient connected to nasal cannula oxygen Cardiovascular status: stable and blood pressure returned to baseline Postop Assessment: no apparent nausea or vomiting Anesthetic complications: no    Last Vitals:  Vitals:   11/28/17 0930 11/28/17 0945  BP: 118/72 115/89  Pulse: 75 77  Resp: 13 17  Temp:    SpO2: 98% 100%                  Effie Berkshire

## 2017-11-28 NOTE — H&P (Signed)
History of Present Illness Tanya Levee MD; 11/13/2017 11:24 AM) The patient is a 42 year old female who presents with a complaint of anal problems. 42 year old female who presents to the office for evaluation of an anal fistula. She states approximately 2 years ago she developed a perirectal abscess. This was drained by her primary care physician. Since that time she has had problems with near constant drainage. She complains of occasional pain. She is at 2 vaginal deliveries with some tearing. She denies any fecal incontinence. She does have diabetes but this is relatively well controlled.   Past Surgical History Doristine Devoid, CMA; 11/13/2017 11:13 AM) Oral Surgery   Diagnostic Studies History Doristine Devoid, CMA; 11/13/2017 11:13 AM) Colonoscopy  never Mammogram  never Pap Smear  1-5 years ago  Allergies Doristine Devoid, CMA; 11/13/2017 11:13 AM) No Known Drug Allergies [11/13/2017]:  Medication History Doristine Devoid, CMA; 11/13/2017 11:15 AM) Lisinopril-Hydrochlorothiazide (20-12.5MG  Tablet, Oral) Active. BusPIRone HCl (15MG  Tablet, Oral) Active. Concerta (18MG  Tablet ER, Oral) Active. Invokana (100MG  Tablet, Oral) Active. LaMICtal (150MG  Tablet, Oral) Active. Trileptal (600MG  Tablet, Oral) Active. MetFORMIN HCl (1000MG  Tablet, Oral) Active. Victoza (18MG /3ML Solution, Subcutaneous) Active. HydrOXYzine HCl (25MG  Tablet, Oral) Active. Medications Reconciled  Social History Doristine Devoid, CMA; 11/13/2017 11:13 AM) Alcohol use  Occasional alcohol use. Caffeine use  Carbonated beverages. Illicit drug use  Remotely quit drug use. Tobacco use  Former smoker.  Family History Doristine Devoid, CMA; 11/13/2017 11:13 AM) Alcohol Abuse  Father. Arthritis  Mother. Colon Polyps  Father. Depression  Father, Mother. Diabetes Mellitus  Mother. Hypertension  Father, Mother. Malignant Neoplasm Of Pancreas  Family Members In General. Prostate Cancer   Father.  Pregnancy / Birth History Doristine Devoid, CMA; 11/13/2017 11:13 AM) Age at menarche  11 years. Gravida  3 Maternal age  74-20 Para  2 Regular periods   Other Problems Doristine Devoid, CMA; 11/13/2017 11:13 AM) Anxiety Disorder  Asthma  Back Pain  Depression  Diabetes Mellitus  Hemorrhoids  High blood pressure  Hypercholesterolemia     Review of Systems (Chemira Jones CMA; 11/13/2017 11:13 AM) General Not Present- Appetite Loss, Chills, Fatigue, Fever, Night Sweats, Weight Gain and Weight Loss. Skin Not Present- Change in Wart/Mole, Dryness, Hives, Jaundice, New Lesions, Non-Healing Wounds, Rash and Ulcer. HEENT Present- Seasonal Allergies and Wears glasses/contact lenses. Not Present- Earache, Hearing Loss, Hoarseness, Nose Bleed, Oral Ulcers, Ringing in the Ears, Sinus Pain, Sore Throat, Visual Disturbances and Yellow Eyes. Respiratory Present- Snoring. Not Present- Bloody sputum, Chronic Cough, Difficulty Breathing and Wheezing. Breast Not Present- Breast Mass, Breast Pain, Nipple Discharge and Skin Changes. Cardiovascular Not Present- Chest Pain, Difficulty Breathing Lying Down, Leg Cramps, Palpitations, Rapid Heart Rate, Shortness of Breath and Swelling of Extremities. Gastrointestinal Present- Rectal Pain. Not Present- Abdominal Pain, Bloating, Bloody Stool, Change in Bowel Habits, Chronic diarrhea, Constipation, Difficulty Swallowing, Excessive gas, Gets full quickly at meals, Hemorrhoids, Indigestion, Nausea and Vomiting. Female Genitourinary Not Present- Frequency, Nocturia, Painful Urination, Pelvic Pain and Urgency. Musculoskeletal Present- Back Pain. Not Present- Joint Pain, Joint Stiffness, Muscle Pain, Muscle Weakness and Swelling of Extremities. Neurological Not Present- Decreased Memory, Fainting, Headaches, Numbness, Seizures, Tingling, Tremor, Trouble walking and Weakness. Psychiatric Present- Anxiety, Bipolar and Frequent crying. Not Present- Change in  Sleep Pattern, Depression and Fearful. Endocrine Not Present- Cold Intolerance, Excessive Hunger, Hair Changes, Heat Intolerance, Hot flashes and New Diabetes. Hematology Not Present- Blood Thinners, Easy Bruising, Excessive bleeding, Gland problems, HIV and Persistent Infections.  Vitals Doristine Devoid CMA;  11/13/2017 11:13 AM) 11/13/2017 11:13 AM Weight: 323.4 lb Height: 71in Body Surface Area: 2.59 m Body Mass Index: 45.1 kg/m  Temp.: 97.60F(Oral)  Pulse: 133 (Regular)  BP: 110/80 (Sitting, Left Arm, Standard)       Physical Exam Tanya Parker(Anarely Nicholls MD; 11/13/2017 11:25 AM) General Mental Status-Alert. General Appearance-Not in acute distress. Build & Nutrition-Well nourished. Posture-Normal posture. Gait-Normal.  Head and Neck Head-normocephalic, atraumatic with no lesions or palpable masses. Trachea-midline.  Chest and Lung Exam Chest and lung exam reveals -on auscultation, normal breath sounds, no adventitious sounds and normal vocal resonance.  Cardiovascular Cardiovascular examination reveals -normal heart sounds, regular rate and rhythm with no murmurs and no digital clubbing, cyanosis, edema, increased warmth or tenderness.  Abdomen Inspection Inspection of the abdomen reveals - No Hernias. Palpation/Percussion Palpation and Percussion of the abdomen reveal - Soft, Non Tender, No Rigidity (guarding), No hepatosplenomegaly and No Palpable abdominal masses.  Neurologic Neurologic evaluation reveals -alert and oriented x 3 with no impairment of recent or remote memory, normal attention span and ability to concentrate, normal sensation and normal coordination.  Musculoskeletal Normal Exam - Bilateral-Upper Extremity Strength Normal and Lower Extremity Strength Normal.    Assessment & Plan ANAL FISTULA (K60.3) Impression: 42 year old female who presents to the office with 2 year history of a symptomatic anal fistula. I have recommended  an anal exam under anesthesia. We will plan on performing a fistulotomy versus seton placement depending on the depth of the fistula. We have discussed this in detail including risk of incontinence with fistulotomy and need for additional surgery and risk of recurrence with a two-stage procedure. I believe she understands this and wishes to proceed with surgery.

## 2017-12-20 ENCOUNTER — Ambulatory Visit: Payer: Self-pay | Admitting: General Surgery

## 2017-12-20 DIAGNOSIS — E782 Mixed hyperlipidemia: Secondary | ICD-10-CM | POA: Diagnosis not present

## 2017-12-20 DIAGNOSIS — Z7984 Long term (current) use of oral hypoglycemic drugs: Secondary | ICD-10-CM | POA: Diagnosis not present

## 2017-12-20 DIAGNOSIS — E1140 Type 2 diabetes mellitus with diabetic neuropathy, unspecified: Secondary | ICD-10-CM | POA: Diagnosis not present

## 2017-12-20 DIAGNOSIS — E1165 Type 2 diabetes mellitus with hyperglycemia: Secondary | ICD-10-CM | POA: Diagnosis not present

## 2017-12-20 NOTE — H&P (Signed)
History of Present Illness Tanya Levee MD; 12/20/2017 10:45 AM) The patient is a 42 year old female who presents with a complaint of anal problems. 42 year old female who presented to the office for evaluation of an anal fistula. She underwent a partial fistulotomy on November 28, 2017. Since that time she has had some trouble with postoperative pain especially with bowel movements but otherwise is doing well and back to work.    Problem List/Past Medical Tanya Levee, MD; 12/20/2017 11:03 AM) ANAL FISTULA (K60.3)  Past Surgical History Tanya Levee, MD; 12/20/2017 11:03 AM) Oral Surgery  Diagnostic Studies History Tanya Levee, MD; 12/20/2017 11:03 AM) Colonoscopy never Mammogram never Pap Smear 1-5 years ago  Allergies (Tanya Parker, RMA; 12/20/2017 10:20 AM) No Known Drug Allergies [11/13/2017]: Allergies Reconciled  Medication History (Tanya Parker, RMA; 12/20/2017 10:20 AM) Ondansetron (4MG  Tablet Disint, 1 (one) Oral every six hours, as needed for nausea, Taken starting 11/30/2017) Active. Lisinopril-Hydrochlorothiazide (20-12.5MG  Tablet, Oral) Active. BusPIRone HCl (15MG  Tablet, Oral) Active. Concerta (18MG  Tablet ER, Oral) Active. Invokana (100MG  Tablet, Oral) Active. LaMICtal (150MG  Tablet, Oral) Active. Trileptal (600MG  Tablet, Oral) Active. MetFORMIN HCl (1000MG  Tablet, Oral) Active. Victoza (18MG /3ML Solution, Subcutaneous) Active. HydrOXYzine HCl (25MG  Tablet, Oral) Active. Medications Reconciled  Social History Tanya Levee, MD; 12/20/2017 11:03 AM) Alcohol use Occasional alcohol use. Caffeine use Carbonated beverages. Illicit drug use Remotely quit drug use. Tobacco use Former smoker.  Family History Tanya Levee, MD; 12/20/2017 11:03 AM) Alcohol Abuse Father. Arthritis Mother. Colon Polyps Father. Depression Father, Mother. Diabetes Mellitus Mother. Hypertension Father, Mother. Malignant Neoplasm Of Pancreas  Family Members In General. Prostate Cancer Father.  Pregnancy / Birth History Tanya Levee, MD; 12/20/2017 11:03 AM) Age at menarche 11 years. Gravida 3 Maternal age 64-20 Para 2 Regular periods  Other Problems Tanya Levee, MD; 12/20/2017 11:03 AM) Anxiety Disorder Asthma Back Pain Depression Diabetes Mellitus Hemorrhoids High blood pressure Hypercholesterolemia     Review of Systems Tanya Levee MD; 12/20/2017 11:03 AM) General Not Present- Appetite Loss, Chills, Fatigue, Fever, Night Sweats, Weight Gain and Weight Loss. Skin Not Present- Change in Wart/Mole, Dryness, Hives, Jaundice, New Lesions, Non-Healing Wounds, Rash and Ulcer. HEENT Present- Seasonal Allergies and Wears glasses/contact lenses. Not Present- Earache, Hearing Loss, Hoarseness, Nose Bleed, Oral Ulcers, Ringing in the Ears, Sinus Pain, Sore Throat, Visual Disturbances and Yellow Eyes. Respiratory Present- Snoring. Not Present- Bloody sputum, Chronic Cough, Difficulty Breathing and Wheezing. Breast Not Present- Breast Mass, Breast Pain, Nipple Discharge and Skin Changes. Cardiovascular Not Present- Chest Pain, Difficulty Breathing Lying Down, Leg Cramps, Palpitations, Rapid Heart Rate, Shortness of Breath and Swelling of Extremities. Gastrointestinal Present- Rectal Pain. Not Present- Abdominal Pain, Bloating, Bloody Stool, Change in Bowel Habits, Chronic diarrhea, Constipation, Difficulty Swallowing, Excessive gas, Gets full quickly at meals, Hemorrhoids, Indigestion, Nausea and Vomiting. Female Genitourinary Not Present- Frequency, Nocturia, Painful Urination, Pelvic Pain and Urgency. Musculoskeletal Present- Back Pain. Not Present- Joint Pain, Joint Stiffness, Muscle Pain, Muscle Weakness and Swelling of Extremities. Neurological Not Present- Decreased Memory, Fainting, Headaches, Numbness, Seizures, Tingling, Tremor, Trouble walking and Weakness. Psychiatric Present- Anxiety, Bipolar and  Frequent crying. Not Present- Change in Sleep Pattern, Depression and Fearful. Endocrine Not Present- Cold Intolerance, Excessive Hunger, Hair Changes, Heat Intolerance, Hot flashes and New Diabetes. Hematology Not Present- Blood Thinners, Easy Bruising, Excessive bleeding, Gland problems, HIV and Persistent Infections.  Vitals (Tanya Parker RMA; 12/20/2017 10:20 AM) 12/20/2017 10:20 AM Weight: 325.8 lb Height: 71in Body Surface Area: 2.59 m Body Mass Index: 45.44  kg/m  Temp.: 98.92F  Pulse: 78 (Regular)  BP: 116/68 (Sitting, Left Arm, Standard)      Physical Exam Tanya Parker(Tanya Salser MD; 12/20/2017 11:03 AM)  General Mental Status-Alert. General Appearance-Not in acute distress. Build & Nutrition-Well nourished. Posture-Normal posture. Gait-Normal.  Head and Neck Head-normocephalic, atraumatic with no lesions or palpable masses. Trachea-midline.  Chest and Lung Exam Chest and lung exam reveals -on auscultation, normal breath sounds, no adventitious sounds and normal vocal resonance.  Cardiovascular Cardiovascular examination reveals -normal heart sounds, regular rate and rhythm with no murmurs and no digital clubbing, cyanosis, edema, increased warmth or tenderness.  Abdomen Inspection Inspection of the abdomen reveals - No Hernias. Palpation/Percussion Palpation and Percussion of the abdomen reveal - Soft, Non Tender, No Rigidity (guarding), No hepatosplenomegaly and No Palpable abdominal masses.  Rectal Note: Left lateral incision healing well. Seton in place.  Neurologic Neurologic evaluation reveals -alert and oriented x 3 with no impairment of recent or remote memory, normal attention span and ability to concentrate, normal sensation and normal coordination.  Musculoskeletal Normal Exam - Bilateral-Upper Extremity Strength Normal and Lower Extremity Strength Normal.    Assessment & Plan Tanya Parker(Tanya Goldsmith MD; 12/20/2017 10:46  AM)  ANAL FISTULA (K60.3) Impression: We discussed her complicated anatomy in detail as she has involvement of mostly external sphincter. We discussed management options and including taking out the seton alone, performing a completion fistulotomy and performing a ligation of internal fistula tract. She has elected to undergo fistulotomy at approximately 8 weeks after her original surgery. She understands that there is a slight risk of incontinence with this procedure but a very low risk of recurrence.

## 2017-12-24 DIAGNOSIS — M25521 Pain in right elbow: Secondary | ICD-10-CM | POA: Diagnosis not present

## 2017-12-27 DIAGNOSIS — M7711 Lateral epicondylitis, right elbow: Secondary | ICD-10-CM | POA: Diagnosis not present

## 2018-01-03 DIAGNOSIS — M7711 Lateral epicondylitis, right elbow: Secondary | ICD-10-CM

## 2018-01-03 HISTORY — DX: Lateral epicondylitis, right elbow: M77.11

## 2018-01-15 ENCOUNTER — Other Ambulatory Visit: Payer: Self-pay

## 2018-01-15 ENCOUNTER — Encounter (HOSPITAL_BASED_OUTPATIENT_CLINIC_OR_DEPARTMENT_OTHER): Payer: Self-pay

## 2018-01-15 NOTE — Progress Notes (Signed)
Spoke with:  Anam NPO:  After Midnight, no gum, candy, or mints   Arrival time: 0645AM Labs: Istat 8 AM medications: Buspirone, Hydroxyzine, Bring Asthma Inhaler Pre op orders: Yes Ride home:  (Overnight stay)  Nida BoatmanBrad (husband) [phone]

## 2018-01-24 ENCOUNTER — Ambulatory Visit (HOSPITAL_BASED_OUTPATIENT_CLINIC_OR_DEPARTMENT_OTHER)
Admission: RE | Admit: 2018-01-24 | Discharge: 2018-01-24 | Disposition: A | Discharge status: Home / Self Care | Payer: 59 | Source: Ambulatory Visit | Attending: General Surgery | Admitting: General Surgery

## 2018-01-24 ENCOUNTER — Ambulatory Visit (HOSPITAL_BASED_OUTPATIENT_CLINIC_OR_DEPARTMENT_OTHER): Payer: 59 | Admitting: Anesthesiology

## 2018-01-24 ENCOUNTER — Encounter (HOSPITAL_BASED_OUTPATIENT_CLINIC_OR_DEPARTMENT_OTHER): Payer: Self-pay

## 2018-01-24 ENCOUNTER — Encounter (HOSPITAL_BASED_OUTPATIENT_CLINIC_OR_DEPARTMENT_OTHER)
Admission: RE | Disposition: A | Discharge status: Home / Self Care | Payer: Self-pay | Source: Ambulatory Visit | Attending: General Surgery

## 2018-01-24 DIAGNOSIS — I10 Essential (primary) hypertension: Secondary | ICD-10-CM | POA: Insufficient documentation

## 2018-01-24 DIAGNOSIS — K603 Anal fistula: Secondary | ICD-10-CM | POA: Insufficient documentation

## 2018-01-24 DIAGNOSIS — Z87891 Personal history of nicotine dependence: Secondary | ICD-10-CM | POA: Diagnosis not present

## 2018-01-24 DIAGNOSIS — E7800 Pure hypercholesterolemia, unspecified: Secondary | ICD-10-CM | POA: Diagnosis not present

## 2018-01-24 DIAGNOSIS — Z7984 Long term (current) use of oral hypoglycemic drugs: Secondary | ICD-10-CM | POA: Insufficient documentation

## 2018-01-24 DIAGNOSIS — Z79899 Other long term (current) drug therapy: Secondary | ICD-10-CM | POA: Diagnosis not present

## 2018-01-24 DIAGNOSIS — E119 Type 2 diabetes mellitus without complications: Secondary | ICD-10-CM | POA: Diagnosis not present

## 2018-01-24 HISTORY — DX: Obesity, unspecified: E66.9

## 2018-01-24 HISTORY — DX: Unspecified hemorrhoids: K64.9

## 2018-01-24 HISTORY — DX: Other seasonal allergic rhinitis: J30.2

## 2018-01-24 HISTORY — DX: Anal fistula: K60.3

## 2018-01-24 HISTORY — DX: Vitamin D deficiency, unspecified: E55.9

## 2018-01-24 HISTORY — PX: FISTULOTOMY: SHX6413

## 2018-01-24 HISTORY — DX: Lateral epicondylitis, right elbow: M77.11

## 2018-01-24 LAB — POCT I-STAT, CHEM 8
BUN: 17 mg/dL (ref 6–20)
CALCIUM ION: 1.25 mmol/L (ref 1.15–1.40)
Chloride: 105 mmol/L (ref 101–111)
Creatinine, Ser: 0.6 mg/dL (ref 0.44–1.00)
GLUCOSE: 155 mg/dL — AB (ref 65–99)
HCT: 41 % (ref 36.0–46.0)
HEMOGLOBIN: 13.9 g/dL (ref 12.0–15.0)
Potassium: 4.1 mmol/L (ref 3.5–5.1)
SODIUM: 139 mmol/L (ref 135–145)
TCO2: 23 mmol/L (ref 22–32)

## 2018-01-24 LAB — GLUCOSE, CAPILLARY: Glucose-Capillary: 140 mg/dL — ABNORMAL HIGH (ref 65–99)

## 2018-01-24 SURGERY — FISTULOTOMY
Anesthesia: Monitor Anesthesia Care

## 2018-01-24 MED ORDER — LIDOCAINE 2% (20 MG/ML) 5 ML SYRINGE
INTRAMUSCULAR | Status: AC
  Filled 2018-01-24: qty 5

## 2018-01-24 MED ORDER — ACETAMINOPHEN 500 MG PO TABS
ORAL_TABLET | ORAL | Status: AC
  Filled 2018-01-24: qty 2

## 2018-01-24 MED ORDER — CELECOXIB 200 MG PO CAPS
200.0000 mg | ORAL_CAPSULE | ORAL | Status: AC
  Administered 2018-01-24: 200 mg via ORAL
  Filled 2018-01-24: qty 1

## 2018-01-24 MED ORDER — ONDANSETRON HCL 4 MG/2ML IJ SOLN
INTRAMUSCULAR | Status: AC
  Filled 2018-01-24: qty 2

## 2018-01-24 MED ORDER — CELECOXIB 200 MG PO CAPS
ORAL_CAPSULE | ORAL | Status: AC
  Filled 2018-01-24: qty 1

## 2018-01-24 MED ORDER — PROMETHAZINE HCL 25 MG/ML IJ SOLN
6.2500 mg | INTRAMUSCULAR | Status: DC | PRN
  Filled 2018-01-24: qty 1

## 2018-01-24 MED ORDER — ACETAMINOPHEN 500 MG PO TABS
1000.0000 mg | ORAL_TABLET | ORAL | Status: AC
  Administered 2018-01-24: 1000 mg via ORAL
  Filled 2018-01-24: qty 2

## 2018-01-24 MED ORDER — BUPIVACAINE LIPOSOME 1.3 % IJ SUSP: INTRAMUSCULAR | Status: DC | PRN

## 2018-01-24 MED ORDER — LIDOCAINE 2% (20 MG/ML) 5 ML SYRINGE
INTRAMUSCULAR | Status: DC | PRN
  Administered 2018-01-24: 80 mg via INTRAVENOUS

## 2018-01-24 MED ORDER — PROPOFOL 500 MG/50ML IV EMUL
INTRAVENOUS | Status: DC | PRN
  Administered 2018-01-24: 200 ug/kg/min via INTRAVENOUS

## 2018-01-24 MED ORDER — FENTANYL CITRATE (PF) 100 MCG/2ML IJ SOLN
25.0000 ug | INTRAMUSCULAR | Status: DC | PRN
  Filled 2018-01-24: qty 1

## 2018-01-24 MED ORDER — FENTANYL CITRATE (PF) 100 MCG/2ML IJ SOLN
INTRAMUSCULAR | Status: DC | PRN
  Administered 2018-01-24: 50 ug via INTRAVENOUS

## 2018-01-24 MED ORDER — PROPOFOL 10 MG/ML IV BOLUS
INTRAVENOUS | Status: DC | PRN
  Administered 2018-01-24: 30 mg via INTRAVENOUS

## 2018-01-24 MED ORDER — PROPOFOL 500 MG/50ML IV EMUL
INTRAVENOUS | Status: AC
  Filled 2018-01-24: qty 50

## 2018-01-24 MED ORDER — HYDROCODONE-ACETAMINOPHEN 5-325 MG PO TABS: 1.0000 | ORAL_TABLET | ORAL | 0 refills | Status: DC | PRN

## 2018-01-24 MED ORDER — LACTATED RINGERS IV SOLN
INTRAVENOUS | Status: DC
  Administered 2018-01-24: 1000 mL via INTRAVENOUS
  Administered 2018-01-24: 10:00:00 via INTRAVENOUS
  Filled 2018-01-24: qty 1000

## 2018-01-24 MED ORDER — DEXAMETHASONE SODIUM PHOSPHATE 10 MG/ML IJ SOLN
INTRAMUSCULAR | Status: AC
  Filled 2018-01-24: qty 1

## 2018-01-24 MED ORDER — PROPOFOL 10 MG/ML IV BOLUS
INTRAVENOUS | Status: AC
  Filled 2018-01-24: qty 40

## 2018-01-24 MED ORDER — FENTANYL CITRATE (PF) 100 MCG/2ML IJ SOLN
INTRAMUSCULAR | Status: AC
  Filled 2018-01-24: qty 2

## 2018-01-24 MED ORDER — BUPIVACAINE-EPINEPHRINE 0.5% -1:200000 IJ SOLN
INTRAMUSCULAR | Status: DC | PRN
  Administered 2018-01-24: 30 mL

## 2018-01-24 MED ORDER — MIDAZOLAM HCL 2 MG/2ML IJ SOLN
INTRAMUSCULAR | Status: DC | PRN
  Administered 2018-01-24: 1 mg via INTRAVENOUS

## 2018-01-24 MED ORDER — MIDAZOLAM HCL 2 MG/2ML IJ SOLN
INTRAMUSCULAR | Status: AC
  Filled 2018-01-24: qty 2

## 2018-01-24 MED ORDER — KETOROLAC TROMETHAMINE 30 MG/ML IJ SOLN
INTRAMUSCULAR | Status: AC
  Filled 2018-01-24: qty 1

## 2018-01-24 SURGICAL SUPPLY — 46 items
BENZOIN TINCTURE PRP APPL 2/3 (GAUZE/BANDAGES/DRESSINGS) ×4 IMPLANT
BLADE EXTENDED COATED 6.5IN (ELECTRODE) IMPLANT
BLADE HEX COATED 2.75 (ELECTRODE) ×2 IMPLANT
BLADE SURG 10 STRL SS (BLADE) IMPLANT
BLADE SURG 15 STRL LF DISP TIS (BLADE) IMPLANT
BLADE SURG 15 STRL SS (BLADE)
BRIEF STRETCH FOR OB PAD LRG (UNDERPADS AND DIAPERS) ×4 IMPLANT
CANISTER SUCT 3000ML PPV (MISCELLANEOUS) ×2 IMPLANT
COVER BACK TABLE 60X90IN (DRAPES) ×2 IMPLANT
COVER MAYO STAND STRL (DRAPES) ×2 IMPLANT
DECANTER SPIKE VIAL GLASS SM (MISCELLANEOUS) ×2 IMPLANT
DRAPE LAPAROTOMY 100X72 PEDS (DRAPES) ×2 IMPLANT
DRAPE UTILITY XL STRL (DRAPES) ×2 IMPLANT
ELECT REM PT RETURN 9FT ADLT (ELECTROSURGICAL) ×2
ELECTRODE REM PT RTRN 9FT ADLT (ELECTROSURGICAL) ×1 IMPLANT
GAUZE SPONGE 4X4 12PLY STRL (GAUZE/BANDAGES/DRESSINGS) ×2 IMPLANT
GAUZE SPONGE 4X4 16PLY XRAY LF (GAUZE/BANDAGES/DRESSINGS) IMPLANT
GLOVE BIO SURGEON STRL SZ 6.5 (GLOVE) ×2 IMPLANT
GLOVE INDICATOR 7.0 STRL GRN (GLOVE) ×2 IMPLANT
GOWN SPEC L3 XXLG W/TWL (GOWN DISPOSABLE) ×2 IMPLANT
HYDROGEN PEROXIDE 16OZ (MISCELLANEOUS) ×2 IMPLANT
KIT TURNOVER CYSTO (KITS) ×2 IMPLANT
LOOP VESSEL MAXI BLUE (MISCELLANEOUS) IMPLANT
NEEDLE HYPO 22GX1.5 SAFETY (NEEDLE) ×2 IMPLANT
NS IRRIG 500ML POUR BTL (IV SOLUTION) ×2 IMPLANT
PACK BASIN DAY SURGERY FS (CUSTOM PROCEDURE TRAY) ×2 IMPLANT
PAD ABD 8X10 STRL (GAUZE/BANDAGES/DRESSINGS) ×2 IMPLANT
PAD ARMBOARD 7.5X6 YLW CONV (MISCELLANEOUS) IMPLANT
PENCIL BUTTON HOLSTER BLD 10FT (ELECTRODE) ×2 IMPLANT
SPONGE HEMORRHOID 8X3CM (HEMOSTASIS) IMPLANT
SPONGE SURGIFOAM ABS GEL 12-7 (HEMOSTASIS) IMPLANT
SUCTION FRAZIER HANDLE 10FR (MISCELLANEOUS)
SUCTION TUBE FRAZIER 10FR DISP (MISCELLANEOUS) IMPLANT
SUT CHROMIC 2 0 SH (SUTURE) ×4 IMPLANT
SUT CHROMIC 3 0 SH 27 (SUTURE) IMPLANT
SUT ETHIBOND 0 (SUTURE) IMPLANT
SUT VIC AB 2-0 SH 27 (SUTURE)
SUT VIC AB 2-0 SH 27XBRD (SUTURE) IMPLANT
SUT VIC AB 3-0 SH 18 (SUTURE) ×2 IMPLANT
SUT VIC AB 4-0 P-3 18XBRD (SUTURE) IMPLANT
SUT VIC AB 4-0 P3 18 (SUTURE)
SYR CONTROL 10ML LL (SYRINGE) ×2 IMPLANT
TOWEL OR 17X24 6PK STRL BLUE (TOWEL DISPOSABLE) ×2 IMPLANT
TRAY DSU PREP LF (CUSTOM PROCEDURE TRAY) ×2 IMPLANT
TUBE CONNECTING 12X1/4 (SUCTIONS) ×2 IMPLANT
YANKAUER SUCT BULB TIP NO VENT (SUCTIONS) ×2 IMPLANT

## 2018-01-24 NOTE — Anesthesia Postprocedure Evaluation (Signed)
Anesthesia Post Note  Patient: Tanya Parker  Procedure(s) Performed: FISTULOTOMY (N/A )     Patient location during evaluation: PACU Anesthesia Type: MAC Level of consciousness: awake and alert Pain management: pain level controlled Vital Signs Assessment: post-procedure vital signs reviewed and stable Respiratory status: spontaneous breathing, nonlabored ventilation, respiratory function stable and patient connected to nasal cannula oxygen Cardiovascular status: stable and blood pressure returned to baseline Postop Assessment: no apparent nausea or vomiting Anesthetic complications: no    Last Vitals:  Vitals:   01/24/18 0652 01/24/18 0917  BP: 113/76   Pulse: 96 (P) 88  Resp: 18 (P) 18  Temp: 37.1 C (P) 36.6 C  SpO2: 100% (P) 99%    Last Pain:  Vitals:   01/24/18 0727  TempSrc:   PainSc: 5                  Jakelin Taussig S

## 2018-01-24 NOTE — Discharge Instructions (Addendum)
No advil, aleve, motrin, ibuprofen until 330 pm today    Post Anesthesia Home Care Instructions  Activity: Get plenty of rest for the remainder of the day. A responsible individual must stay with you for 24 hours following the procedure.  For the next 24 hours, DO NOT: -Drive a car -Advertising copywriterperate machinery -Drink alcoholic beverages -Take any medication unless instructed by your physician -Make any legal decisions or sign important papers.  Meals: Start with liquid foods such as gelatin or soup. Progress to regular foods as tolerated. Avoid greasy, spicy, heavy foods. If nausea and/or vomiting occur, drink only clear liquids until the nausea and/or vomiting subsides. Call your physician if vomiting continues.  Special Instructions/Symptoms: Your throat may feel dry or sore from the anesthesia or the breathing tube placed in your throat during surgery. If this causes discomfort, gargle with warm salt water. The discomfort should disappear within 24 hours.  If you had a scopolamine patch placed behind your ear for the management of post- operative nausea and/or vomiting:  1. The medication in the patch is effective for 72 hours, after which it should be removed.  Wrap patch in a tissue and discard in the trash. Wash hands thoroughly with soap and water. 2. You may remove the patch earlier than 72 hours if you experience unpleasant side effects which may include dry mouth, dizziness or visual disturbances. 3. Avoid touching the patch. Wash your hands with soap and water after contact with the patch.   ANORECTAL SURGERY: POST OP INSTRUCTIONS 1. Take your usually prescribed home medications unless otherwise directed. 2. DIET: During the first few hours after surgery sip on some liquids until you are able to urinate.  It is normal to not urinate for several hours after this surgery.  If you feel uncomfortable, please contact the office for instructions.  After you are able to urinate,you may eat,  if you feel like it.  Follow a light bland diet the first 24 hours after arrival home, such as soup, liquids, crackers, etc.  Be sure to include lots of fluids daily (6-8 glasses).  Avoid fast food or heavy meals, as your are more likely to get nauseated.  Eat a low fat diet the next few days after surgery.  Limit caffeine intake to 1-2 servings a day. 3. PAIN CONTROL: a. Pain is best controlled by a usual combination of several different methods TOGETHER: i. Muscle relaxation: Soak in a warm bath (or Sitz bath) three times a day and after bowel movements.  Continue to do this until all pain is resolved. ii. Over the counter pain medication iii. Prescription pain medication b. Most patients will experience some swelling and discomfort in the anus/rectal area and incisions.  Heat such as warm towels, sitz baths, warm baths, etc to help relax tight/sore spots and speed recovery.  Some people prefer to use ice, especially in the first couple days after surgery, as it may decrease the pain and swelling, or alternate between ice & heat.  Experiment to what works for you.  Swelling and bruising can take several weeks to resolve.  Pain can take even longer to completely resolve. c. It is helpful to take an over-the-counter pain medication regularly for the first few weeks.  Choose one of the following that works best for you: i. Naproxen (Aleve, etc)  Two 220mg  tabs twice a day ii. Ibuprofen (Advil, etc) Three 200mg  tabs four times a day (every meal & bedtime) d. A  prescription for pain medication (  such as percocet, oxycodone, hydrocodone, etc) should be given to you upon discharge.  Take your pain medication as prescribed.  i. If you are having problems/concerns with the prescription medicine (does not control pain, nausea, vomiting, rash, itching, etc), please call us [phone] to see if we need to switch you to a different pain medicine that will work better for you and/or control your side effect  better. ii. If you need a refill on your pain medication, please contact your pharmacy.  They will contact our office to request authorization. Prescriptions will not be filled after 5 pm or on week-ends. 4. KEEP YOUR BOWELS REGULAR and AVOID CONSTIPATION a. The goal is one to two soft bowel movements a day.  You should at least have a bowel movement every other day. b. Avoid getting constipated.  Between the surgery and the pain medications, it is common to experience some constipation. This can be very painful after rectal surgery.  Increasing fluid intake and taking a fiber supplement (such as Metamucil, Citrucel, FiberCon, etc) 1-2 times a day regularly will usually help prevent this problem from occurring.  A stool softener like colace is also recommended.  This can be purchased over the counter at your pharmacy.  You can take it up to 3 times a day.  If you do not have a bowel movement after 24 hrs since your surgery, take one does of milk of magnesia.  If you still haven't had a bowel movement 8-12 hours after that dose, take another dose.  If you don't have a bowel movement 48 hrs after surgery, purchase a Fleets enema from the drug store and administer gently per package instructions.  If you still are having trouble with your bowel movements after that, please call the office for further instructions. c. If you develop diarrhea or have many loose bowel movements, simplify your diet to bland foods & liquids for a few days.  Stop any stool softeners and decrease your fiber supplement.  Switching to mild anti-diarrheal medications (Kayopectate, Pepto Bismol) can help.  If this worsens or does not improve, please call us.  5. Wound Care a. Remove your bandages before your first bowel movement or 8 hours after surgery.     b. Remove any wound packing material at this tim,e as well.  You do not need to repack the wound unless instructed otherwise.  Wear an absorbent pad or soft cotton gauze in your  underwear to catch any drainage and help keep the area clean. You should change this every 2-3 hours while awake. c. Keep the area clean and dry.  Bathe / shower every day, especially after bowel movements.  Keep the area clean by showering / bathing over the incision / wound.   It is okay to soak an open wound to help wash it.  Wet wipes or showers / gentle washing after bowel movements is often less traumatic than regular toilet paper. d. Bonita Quin may have some styrofoam-like soft packing in the rectum which will come out with the first bowel movement.  e. You will often notice bleeding with bowel movements.  This should slow down by the end of the first week of surgery f. Expect some drainage.  This should slow down, too, by the end of the first week of surgery.  Wear an absorbent pad or soft cotton gauze in your underwear until the drainage stops. g. Do Not sit on a rubber or pillow ring.  This can make you symptoms worse.  You may sit on a soft pillow if needed.  6. ACTIVITIES as tolerated:   a. You may resume regular (light) daily activities beginning the next day--such as daily self-care, walking, climbing stairs--gradually increasing activities as tolerated.  If you can walk 30 minutes without difficulty, it is safe to try more intense activity such as jogging, treadmill, bicycling, low-impact aerobics, swimming, etc. b. Save the most intensive and strenuous activity for last such as sit-ups, heavy lifting, contact sports, etc  Refrain from any heavy lifting or straining until you are off narcotics for pain control.   c. You may drive when you are no longer taking prescription pain medication, you can comfortably sit for long periods of time, and you can safely maneuver your car and apply brakes. d. Bonita Quin may have sexual intercourse when it is comfortable.  7. FOLLOW UP in our office a. Please call [facility] at [phone] to set up an appointment to see your surgeon in the office for a follow-up  appointment approximately 3-4 weeks after your surgery. b. Make sure that you call for this appointment the day you arrive home to insure a convenient appointment time. 10. IF YOU HAVE DISABILITY OR FAMILY LEAVE FORMS, BRING THEM TO THE OFFICE FOR PROCESSING.  DO NOT GIVE THEM TO YOUR DOCTOR.     WHEN TO CALL us [phone]: 1. Poor pain control 2. Reactions / problems with new medications (rash/itching, nausea, etc)  3. Fever over 101.5 F (38.5 C) 4. Inability to urinate 5. Nausea and/or vomiting 6. Worsening swelling or bruising 7. Continued bleeding from incision. 8. Increased pain, redness, or drainage from the incision  The clinic staff is available to answer your questions during regular business hours (8:30am-5pm).  Please dont hesitate to call and ask to speak to one of our nurses for clinical concerns.   A surgeon from [facility] is always on call at the hospitals   If you have a medical emergency, go to the nearest emergency room or call 911.    [facility] [address], [place], [place]  [address] ? MAIN: [phone] ? TOLL FREE: [phone] ? FAX [phone] [email or web address]

## 2018-01-24 NOTE — Transfer of Care (Signed)
Immediate Anesthesia Transfer of Care Note  Patient: Tanya Parker  Procedure(s) Performed: Procedure(s) (LRB): FISTULOTOMY (N/A)  Patient Location: PACU  Anesthesia Type: MAC  Level of Consciousness: awake, alert , oriented and patient cooperative  Airway & Oxygen Therapy: Patient Spontanous Breathing and Patient connected to face mask oxygen  Post-op Assessment: Report given to PACU RN and Post -op Vital signs reviewed and stable  Post vital signs: Reviewed and stable  Complications: No apparent anesthesia complications  Last Vitals:  Vitals Value Taken Time  BP    Temp    Pulse 89 01/24/2018  9:20 AM  Resp 18 01/24/2018  9:20 AM  SpO2 99 % 01/24/2018  9:20 AM  Vitals shown include unvalidated device data.  Last Pain:  Vitals:   01/24/18 0727  TempSrc:   PainSc: 5       Patients Stated Pain Goal: 8("8-9") (01/24/18 0727)

## 2018-01-24 NOTE — H&P (Signed)
The patient is a 42 year old female who presents with a complaint of anal problems. 42 year old female who presented to the office for evaluation of an anal fistula. She underwent a partial fistulotomy on November 28, 2017. Since that time she has had some trouble with postoperative pain especially with bowel movements but otherwise is doing well and back to work.    Problem List/Past Medical Romie Levee(Kahlel Peake, MD; 12/20/2017 11:03 AM) ANAL FISTULA (K60.3)  Past Surgical History Romie Levee(Shakeira Rhee, MD; 12/20/2017 11:03 AM) Oral Surgery  Diagnostic Studies History Romie Levee(Chas Axel, MD; 12/20/2017 11:03 AM) Colonoscopy never Mammogram never Pap Smear 1-5 years ago  Allergies  No Known Drug Allergies [11/13/2017]: Allergies Reconciled  Medication History Ondansetron (4MG  Tablet Disint, 1 (one) Oral every six hours, as needed for nausea, Taken starting 11/30/2017) Active. Lisinopril-Hydrochlorothiazide (20-12.5MG  Tablet, Oral) Active. BusPIRone HCl (15MG  Tablet, Oral) Active. Concerta (18MG  Tablet ER, Oral) Active. Invokana (100MG  Tablet, Oral) Active. LaMICtal (150MG  Tablet, Oral) Active. Trileptal (600MG  Tablet, Oral) Active. MetFORMIN HCl (1000MG  Tablet, Oral) Active. Victoza (18MG /3ML Solution, Subcutaneous) Active. HydrOXYzine HCl (25MG  Tablet, Oral) Active. Medications Reconciled  Social History Romie Levee(Garnette Greb, MD; 12/20/2017 11:03 AM) Alcohol use Occasional alcohol use. Caffeine use Carbonated beverages. Illicit drug use Remotely quit drug use. Tobacco use Former smoker.  Family History Romie Levee(Jolan Upchurch, MD; 12/20/2017 11:03 AM) Alcohol Abuse Father. Arthritis Mother. Colon Polyps Father. Depression Father, Mother. Diabetes Mellitus Mother. Hypertension Father, Mother. Malignant Neoplasm Of Pancreas Family Members In General. Prostate Cancer Father.  Pregnancy / Birth History Romie Levee(Rigby Leonhardt, MD; 12/20/2017 11:03 AM) Age at menarche 11  years. Gravida 3 Maternal age 43-20 Para 2 Regular periods  Other Problems Romie Levee(Kimberly Coye, MD; 12/20/2017 11:03 AM) Anxiety Disorder Asthma Back Pain Depression Diabetes Mellitus Hemorrhoids High blood pressure Hypercholesterolemia     Review of Systems  General Not Present- Appetite Loss, Chills, Fatigue, Fever, Night Sweats, Weight Gain and Weight Loss. Skin Not Present- Change in Wart/Mole, Dryness, Hives, Jaundice, New Lesions, Non-Healing Wounds, Rash and Ulcer. HEENT Present- Seasonal Allergies and Wears glasses/contact lenses. Not Present- Earache, Hearing Loss, Hoarseness, Nose Bleed, Oral Ulcers, Ringing in the Ears, Sinus Pain, Sore Throat, Visual Disturbances and Yellow Eyes. Respiratory Present- Snoring. Not Present- Bloody sputum, Chronic Cough, Difficulty Breathing and Wheezing. Breast Not Present- Breast Mass, Breast Pain, Nipple Discharge and Skin Changes. Cardiovascular Not Present- Chest Pain, Difficulty Breathing Lying Down, Leg Cramps, Palpitations, Rapid Heart Rate, Shortness of Breath and Swelling of Extremities. Gastrointestinal Present- Rectal Pain. Not Present- Abdominal Pain, Bloating, Bloody Stool, Change in Bowel Habits, Chronic diarrhea, Constipation, Difficulty Swallowing, Excessive gas, Gets full quickly at meals, Hemorrhoids, Indigestion, Nausea and Vomiting. Female Genitourinary Not Present- Frequency, Nocturia, Painful Urination, Pelvic Pain and Urgency. Musculoskeletal Present- Back Pain. Not Present- Joint Pain, Joint Stiffness, Muscle Pain, Muscle Weakness and Swelling of Extremities. Neurological Not Present- Decreased Memory, Fainting, Headaches, Numbness, Seizures, Tingling, Tremor, Trouble walking and Weakness. Psychiatric Present- Anxiety, Bipolar and Frequent crying. Not Present- Change in Sleep Pattern, Depression and Fearful. Endocrine Not Present- Cold Intolerance, Excessive Hunger, Hair Changes, Heat Intolerance, Hot  flashes and New Diabetes. Hematology Not Present- Blood Thinners, Easy Bruising, Excessive bleeding, Gland problems, HIV and Persistent Infections.  BP 113/76   Pulse 96   Temp 98.7 F (37.1 C) (Oral)   Resp 18   Ht 5\' 11"  (1.803 m)   Wt (!) 149.1 kg (328 lb 11.2 oz)   LMP 01/01/2018 (Exact Date)   SpO2 100%   BMI 45.84 kg/m  Physical Exam   General Mental Status-Alert. General Appearance-Not in acute distress. Build & Nutrition-Well nourished. Posture-Normal posture. Gait-Normal.  Head and Neck Head-normocephalic, atraumatic with no lesions or palpable masses. Trachea-midline.  Chest and Lung Exam Chest and lung exam reveals -on auscultation, normal breath sounds, no adventitious sounds and normal vocal resonance.  Cardiovascular Cardiovascular examination reveals -normal heart sounds, regular rate and rhythm with no murmurs and no digital clubbing, cyanosis, edema, increased warmth or tenderness.  Abdomen Inspection Inspection of the abdomen reveals - No Hernias. Palpation/Percussion Palpation and Percussion of the abdomen reveal - Soft, Non Tender, No Rigidity (guarding), No hepatosplenomegaly and No Palpable abdominal masses.  Rectal Note: Left lateral incision healing well. Seton in place.  Neurologic Neurologic evaluation reveals -alert and oriented x 3 with no impairment of recent or remote memory, normal attention span and ability to concentrate, normal sensation and normal coordination.  Musculoskeletal Normal Exam - Bilateral-Upper Extremity Strength Normal and Lower Extremity Strength Normal.    Assessment & Plan   ANAL FISTULA (K60.3) Impression: We discussed her complicated anatomy in detail as she has involvement of mostly external sphincter. We discussed management options and including taking out the seton alone, performing a completion fistulotomy and performing a ligation of internal fistula tract. She  has elected to undergo fistulotomy at approximately 8 weeks after her original surgery. She understands that there is a slight risk of incontinence with this procedure but a very low risk of recurrence.

## 2018-01-24 NOTE — Anesthesia Preprocedure Evaluation (Addendum)
Anesthesia Evaluation  Patient identified by MRN, date of birth, ID band Patient awake    Reviewed: Allergy & Precautions, NPO status , Patient's Chart, lab work & pertinent test results  Airway Mallampati: II  TM Distance: >3 FB Neck ROM: Full    Dental no notable dental hx.    Pulmonary neg pulmonary ROS, former smoker,    Pulmonary exam normal breath sounds clear to auscultation       Cardiovascular hypertension, Normal cardiovascular exam Rhythm:Regular Rate:Normal     Neuro/Psych Bipolar Disorder negative neurological ROS  negative psych ROS   GI/Hepatic negative GI ROS, Neg liver ROS,   Endo/Other  diabetesMorbid obesity  Renal/GU negative Renal ROS  negative genitourinary   Musculoskeletal negative musculoskeletal ROS (+)   Abdominal   Peds negative pediatric ROS (+)  Hematology negative hematology ROS (+)   Anesthesia Other Findings   Reproductive/Obstetrics negative OB ROS                             Anesthesia Physical Anesthesia Plan  ASA: III  Anesthesia Plan: MAC   Post-op Pain Management:    Induction: Intravenous  PONV Risk Score and Plan: 2 and Ondansetron, Propofol infusion, Midazolam and Treatment may vary due to age or medical condition  Airway Management Planned: Simple Face Mask  Additional Equipment:   Intra-op Plan:   Post-operative Plan:   Informed Consent: I have reviewed the patients History and Physical, chart, labs and discussed the procedure including the risks, benefits and alternatives for the proposed anesthesia with the patient or authorized representative who has indicated his/her understanding and acceptance.   Dental advisory given  Plan Discussed with: CRNA and Surgeon  Anesthesia Plan Comments: (Ketamine boluses given during most recent procedure Home pregnancy test yesterday negative)       Anesthesia Quick Evaluation

## 2018-01-24 NOTE — Op Note (Addendum)
01/24/2018  9:11 AM  PATIENT:  Tanya Parker  42 y.o. female  Patient Care Team: London Pepper, MD as PCP - General (Family Medicine)  PRE-OPERATIVE DIAGNOSIS:  Anal fistula  POST-OPERATIVE DIAGNOSIS:  Anal fistula  PROCEDURE:   FISTULOTOMY   Surgeon(s): Leighton Ruff, MD  ASSISTANT: none   ANESTHESIA:   local and MAC  SPECIMEN:  No Specimen  DISPOSITION OF SPECIMEN:  PATHOLOGY  COUNTS:  YES  PLAN OF CARE: Discharge to home after PACU  PATIENT DISPOSITION:  PACU - hemodynamically stable.  INDICATION: 43 y.o. F with anal fistula.  A seton was placed 2 months ago after a fistula traversing only external sphincter was noted.  We discussed the risks and benefits of advancement flap vs fistulotomy and that a LIFT was not a good option for her.  She decided to proceed with fistulotomy and is aware that she could have decreased muscle function after surgery.     OR FINDINGS: anal fistula  DESCRIPTION: the patient was identified in the preoperative holding area and taken to the OR where they were laid on the operating room table.  MAC anesthesia was induced without difficulty. The patient was then positioned in prone jackknife position with buttocks gently taped apart.  The patient was then prepped and draped in usual sterile fashion.  SCDs were noted to be in place prior to the initiation of anesthesia. A surgical timeout was performed indicating the correct patient, procedure, positioning and need for preoperative antibiotics.  A rectal block was performed using Marcaine with epinephrine.    I began with a digital rectal exam.  There was good sphincter tone.  I then placed a Hill-Ferguson anoscope into the anal canal and evaluated this completely.  I then placed a fistula probe into the fistula and performed a fistulotomy using Bovie electrocautery.  The external sphincter was then tacked to the fistula tract using interrupted 3-0 Vicryl sutures.  The cut edges were then  marsupialized using a running 2-0 chromic suture.  Patient tolerated this well and was sent to the post operative care unit in stable condition.  All counts were correct per operating room staff.  I have reviewed the [employer].

## 2018-01-28 ENCOUNTER — Encounter (HOSPITAL_BASED_OUTPATIENT_CLINIC_OR_DEPARTMENT_OTHER): Payer: Self-pay | Admitting: General Surgery

## 2018-02-22 ENCOUNTER — Encounter: Payer: Self-pay | Admitting: Podiatry

## 2018-02-22 ENCOUNTER — Ambulatory Visit (INDEPENDENT_AMBULATORY_CARE_PROVIDER_SITE_OTHER): Payer: 59 | Admitting: Podiatry

## 2018-02-22 ENCOUNTER — Ambulatory Visit (INDEPENDENT_AMBULATORY_CARE_PROVIDER_SITE_OTHER): Payer: 59

## 2018-02-22 VITALS — BP 99/61 | HR 93 | Resp 16

## 2018-02-22 DIAGNOSIS — M7741 Metatarsalgia, right foot: Secondary | ICD-10-CM | POA: Diagnosis not present

## 2018-02-22 DIAGNOSIS — E119 Type 2 diabetes mellitus without complications: Secondary | ICD-10-CM | POA: Diagnosis not present

## 2018-02-22 DIAGNOSIS — E1142 Type 2 diabetes mellitus with diabetic polyneuropathy: Secondary | ICD-10-CM

## 2018-02-22 DIAGNOSIS — S90851A Superficial foreign body, right foot, initial encounter: Secondary | ICD-10-CM

## 2018-02-22 DIAGNOSIS — M7742 Metatarsalgia, left foot: Secondary | ICD-10-CM

## 2018-02-22 DIAGNOSIS — M792 Neuralgia and neuritis, unspecified: Secondary | ICD-10-CM | POA: Diagnosis not present

## 2018-02-22 NOTE — Progress Notes (Signed)
Subjective:  Patient ID: Tanya Parker, female    DOB: Oct 27, 1975,  MRN: [id number]  Chief Complaint  Patient presents with  . Diabetes    Patient states she is a diabetic with neuropathy and this morning noticed a red, hardened area plantar forefoot right, concerned being diabetic - last a1c was 6.6  . New Patient (Initial Visit)   42 y.o. female presents with the above complaint.  States that she is diabetic with last A1c of 6.6 endorses numbness and tingling in the feet and cramping in the toes.  Denies burning.  States that she noticed a hardened area this morning is concerned that she has an ulcer.  Due to the neuropathy she did not feel any discomfort from it.  Past Medical History:  Diagnosis Date  . Anal fistula   . Anxiety   . Asthma   . Bipolar disorder (HCC)   . Chronic back pain    fell in tub few yrs ago  . Diabetes mellitus without complication (HCC)    type 2  . Hemorrhoids   . Hypertension   . Obesity   . Right tennis elbow 01/03/2018  . Seasonal allergies   . Vitamin D deficiency    Past Surgical History:  Procedure Laterality Date  . FISTULOTOMY  11/2017   Partial   . FISTULOTOMY N/A 01/24/2018   Procedure: FISTULOTOMY;  Surgeon: Romie Levee, MD;  Location: [facility];  Service: General;  Laterality: N/A;  . WISDOM TOOTH EXTRACTION      Current Outpatient Medications:  .  busPIRone (BUSPAR) 15 MG tablet, Take 15 mg by mouth 2 (two) times daily., Disp: , Rfl:  .  canagliflozin (INVOKANA) 100 MG TABS tablet, Take 100 mg by mouth daily before breakfast., Disp: , Rfl:  .  citalopram (CELEXA) 10 MG tablet, Take 10 mg by mouth daily., Disp: , Rfl:  .  fluconazole (DIFLUCAN) 150 MG tablet, , Disp: , Rfl:  .  HYDROcodone-acetaminophen (NORCO/VICODIN) 5-325 MG tablet, Take 1 tablet by mouth every 4 (four) hours as needed., Disp: 10 tablet, Rfl: 0 .  hydrOXYzine (ATARAX/VISTARIL) 25 MG tablet, , Disp: , Rfl: 0 .  hydrOXYzine (VISTARIL) 25 MG  capsule, Take 25 mg by mouth at bedtime. Told by surgeon may take prn am of surgery, Disp: , Rfl:  .  lamoTRIgine (LAMICTAL) 150 MG tablet, , Disp: , Rfl:  .  liraglutide (VICTOZA) 18 MG/3ML SOPN, Inject into the skin every morning., Disp: , Rfl:  .  lisinopril-hydrochlorothiazide (PRINZIDE,ZESTORETIC) 20-12.5 MG tablet, Take 1 tablet by mouth daily., Disp: , Rfl:  .  loratadine (CLARITIN) 10 MG tablet, Take 10 mg by mouth daily., Disp: , Rfl:  .  metFORMIN (GLUCOPHAGE) 1000 MG tablet, Take 1,000 mg by mouth 2 (two) times daily with a meal., Disp: , Rfl:  .  methylphenidate 18 MG PO CR tablet, Take 18 mg by mouth daily., Disp: , Rfl:  .  OXcarbazepine (TRILEPTAL PO), Take 900 mg by mouth at bedtime., Disp: , Rfl:  .  rosuvastatin (CRESTOR) 10 MG tablet, , Disp: , Rfl:  .  traMADol (ULTRAM) 50 MG tablet, , Disp: , Rfl: 0 .  VENTOLIN HFA 108 (90 Base) MCG/ACT inhaler, , Disp: , Rfl:   No Known Allergies Review of Systems: Negative except as noted in the HPI. Denies N/V/F/Ch. Objective:   Vitals:   02/22/18 0901  BP: 99/61  Pulse: 93  Resp: 16   General AA&O x3. Normal mood and affect.  Vascular Dorsalis pedis and posterior tibial pulses  present 2+ bilaterally  Capillary refill normal to all digits. Pedal hair growth normal.  Neurologic Epicritic sensation grossly present.  Dermatologic No open lesions. Interspaces clear of maceration. Nails well groomed and normal in appearance. Lesion plantar forefoot right proximal to the metatarsal heads with noted glass upon debridement.  Orthopedic: MMT 5/5 in dorsiflexion, plantarflexion, inversion, and eversion. Normal joint ROM without pain or crepitus.   Assessment & Plan:  Patient was evaluated and treated and all questions answered.  Foreign Body R Foot -Area debrided with a 312 blade. Subcutaneous piece of glass encountered and removed. Area further debrided to insure no remaining FB. XR taken and reviewed no continued evidence of  FB.   Metatarsalgia -XRs show increased metatarsal girth suggestive of stress reaction. Discussed shoe gear and OTC inserts.  Return in about 2 weeks (around 03/08/2018) for foreign body f/u.

## 2018-03-08 ENCOUNTER — Ambulatory Visit: Payer: 59 | Admitting: Podiatry

## 2018-05-07 DIAGNOSIS — Z719 Counseling, unspecified: Secondary | ICD-10-CM | POA: Diagnosis not present

## 2018-06-22 ENCOUNTER — Encounter: Payer: Self-pay | Admitting: *Deleted

## 2018-06-24 DIAGNOSIS — E119 Type 2 diabetes mellitus without complications: Secondary | ICD-10-CM | POA: Diagnosis not present

## 2018-06-24 DIAGNOSIS — I10 Essential (primary) hypertension: Secondary | ICD-10-CM | POA: Diagnosis not present

## 2018-06-24 DIAGNOSIS — Z23 Encounter for immunization: Secondary | ICD-10-CM | POA: Diagnosis not present

## 2018-06-26 DIAGNOSIS — M25572 Pain in left ankle and joints of left foot: Secondary | ICD-10-CM | POA: Diagnosis not present

## 2018-07-01 ENCOUNTER — Emergency Department (HOSPITAL_COMMUNITY)
Admission: EM | Admit: 2018-07-01 | Discharge: 2018-07-01 | Disposition: A | Discharge status: Home / Self Care | Payer: 59 | Attending: Emergency Medicine | Admitting: Emergency Medicine

## 2018-07-01 ENCOUNTER — Emergency Department (HOSPITAL_COMMUNITY): Payer: 59

## 2018-07-01 ENCOUNTER — Other Ambulatory Visit: Payer: Self-pay

## 2018-07-01 ENCOUNTER — Encounter (HOSPITAL_COMMUNITY): Payer: Self-pay | Admitting: Emergency Medicine

## 2018-07-01 DIAGNOSIS — I10 Essential (primary) hypertension: Secondary | ICD-10-CM | POA: Insufficient documentation

## 2018-07-01 DIAGNOSIS — R001 Bradycardia, unspecified: Secondary | ICD-10-CM | POA: Insufficient documentation

## 2018-07-01 DIAGNOSIS — R0602 Shortness of breath: Secondary | ICD-10-CM | POA: Diagnosis not present

## 2018-07-01 DIAGNOSIS — Z87891 Personal history of nicotine dependence: Secondary | ICD-10-CM | POA: Diagnosis not present

## 2018-07-01 DIAGNOSIS — R079 Chest pain, unspecified: Secondary | ICD-10-CM | POA: Insufficient documentation

## 2018-07-01 DIAGNOSIS — J45909 Unspecified asthma, uncomplicated: Secondary | ICD-10-CM | POA: Diagnosis not present

## 2018-07-01 DIAGNOSIS — E119 Type 2 diabetes mellitus without complications: Secondary | ICD-10-CM | POA: Diagnosis not present

## 2018-07-01 LAB — CBC
HEMATOCRIT: 39.5 % (ref 36.0–46.0)
Hemoglobin: 13.4 g/dL (ref 12.0–15.0)
MCH: 28.9 pg (ref 26.0–34.0)
MCHC: 33.9 g/dL (ref 30.0–36.0)
MCV: 85.3 fL (ref 78.0–100.0)
Platelets: 261 10*3/uL (ref 150–400)
RBC: 4.63 MIL/uL (ref 3.87–5.11)
RDW: 13.6 % (ref 11.5–15.5)
WBC: 10 10*3/uL (ref 4.0–10.5)

## 2018-07-01 LAB — BASIC METABOLIC PANEL
Anion gap: 10 (ref 5–15)
BUN: 16 mg/dL (ref 6–20)
CHLORIDE: 102 mmol/L (ref 98–111)
CO2: 28 mmol/L (ref 22–32)
CREATININE: 0.69 mg/dL (ref 0.44–1.00)
Calcium: 9 mg/dL (ref 8.9–10.3)
GFR calc non Af Amer: >60 mL/min (ref >60)
Glucose, Bld: 206 mg/dL — ABNORMAL HIGH (ref 70–99)
POTASSIUM: 4.2 mmol/L (ref 3.5–5.1)
SODIUM: 140 mmol/L (ref 135–145)

## 2018-07-01 LAB — I-STAT BETA HCG BLOOD, ED (MC, WL, AP ONLY)

## 2018-07-01 LAB — I-STAT TROPONIN, ED: Troponin i, poc: 0 ng/mL (ref 0.00–0.08)

## 2018-07-01 LAB — D-DIMER, QUANTITATIVE (NOT AT ARMC): D DIMER QUANT: 0.88 ug{FEU}/mL — AB (ref 0.00–0.50)

## 2018-07-01 MED ORDER — IOPAMIDOL (ISOVUE-370) INJECTION 76%
100.0000 mL | Freq: Once | INTRAVENOUS | Status: AC | PRN
  Administered 2018-07-01: 100 mL via INTRAVENOUS

## 2018-07-01 MED ORDER — IOPAMIDOL (ISOVUE-370) INJECTION 76%
INTRAVENOUS | Status: AC
  Filled 2018-07-01: qty 100

## 2018-07-01 NOTE — ED Provider Notes (Signed)
Emergency Department Provider Note   I have reviewed the triage vital signs and the nursing notes.   HISTORY  Chief Complaint Bradycardia; Shortness of Breath; and Chest Pain   HPI Tanya Parker is a 42 y.o. female with multiple medical problems documented below the presents the emergency department today with bradycardia.  Patient states that she started prednisone on Thursday 10 mg a day for some foot swelling.  On review of her apple watch EKG tracings it appears that she started having a resting heart rate of below 60 on Saturday.  Normally her resting heart rates are in the upper 60s to low 70s however Saturday it was 54 and on Sunday it was 47 today it was 46.  Today and yesterday she had some mild chest discomfort with mild shortness of breath as well.  No fevers or cough.  No nausea vomiting or lightheadedness.  She states that she relates she feels normal.  No lower extremity swelling aside from the left foot that is been there for a week. No other associated or modifying symptoms.    Past Medical History:  Diagnosis Date  . Anal fistula   . Anxiety   . Asthma   . Bipolar disorder (HCC)   . Chronic back pain    fell in tub few yrs ago  . Diabetes mellitus without complication (HCC)    type 2  . Hemorrhoids   . Hypertension   . Obesity   . Right tennis elbow 01/03/2018  . Seasonal allergies   . Vitamin D deficiency     Patient Active Problem List   Diagnosis Date Noted  . Class 3 obesity with serious comorbidity and body mass index (BMI) of 45.0 to 49.9 in adult 06/25/2017  . Hypercholesterolemia 06/25/2017  . Hypertension, essential 06/25/2017  . Type 2 diabetes mellitus with hyperglycemia, without long-term current use of insulin (HCC) 06/25/2017  . Vitamin D insufficiency 06/25/2017    Past Surgical History:  Procedure Laterality Date  . FISTULOTOMY  11/2017   Partial   . FISTULOTOMY N/A 01/24/2018   Procedure: FISTULOTOMY;  Surgeon: Romie Leveehomas, Alicia,  MD;  [facility];  Service: General;  Laterality: N/A;  . WISDOM TOOTH EXTRACTION      Current Outpatient Rx  . Order #: [id number] Class: Historical Med  . Order #: [id number] Class: Historical Med  . Order #: [id number] Class: Historical Med  . Order #: [id number] Class: Historical Med  . Order #: [id number] Class: Historical Med  . Order #: [id number] Class: Historical Med  . Order #: [id number] Class: Historical Med  . Order #: [id number] Class: Historical Med  . Order #: [id number] Class: Historical Med  . Order #: [id number] Class: Historical Med  . Order #: [id number] Class: Historical Med  . Order #: [id number] Class: Historical Med  . Order #: [id number] Class: Historical Med  . Order #: [id number] Class: Print    Allergies Patient has no known allergies.  No family history on file.  Social History Social History   Tobacco Use  . Smoking status: Former Smoker    Packs/day: 0.25    Years: 2.00    Pack years: 0.50    Types: Cigarettes  . Smokeless tobacco: Never Used  . Tobacco comment: quit sept 2018  Substance Use Topics  . Alcohol use: No    Frequency: Never  . Drug use: No    Review of Systems  All other systems negative except as documented in the HPI. All pertinent positives and negatives as reviewed in the HPI. ____________________________________________  PHYSICAL EXAM:  VITAL SIGNS: ED Triage Vitals [07/01/18 0840]  Enc Vitals Group     BP (!) 161/101     Pulse Rate (!) 56     Resp 17     Temp 98.7 F (37.1 C)     Temp Source Oral     SpO2 99 %     Weight      Height      Head Circumference      Peak Flow      Pain Score      Pain Loc      Pain Edu?      Excl. in GC?     Constitutional: Alert and oriented. Well appearing and in no acute distress. Eyes: Conjunctivae are normal. PERRL. EOMI. Head: Atraumatic. Nose: No congestion/rhinnorhea. Mouth/Throat: Mucous membranes are moist.  Oropharynx non-erythematous. Neck: No stridor.   No meningeal signs.   Cardiovascular: Normal rate, regular rhythm. Good peripheral circulation. Grossly normal heart sounds.   Respiratory: Normal respiratory effort.  No retractions. Lungs CTAB. Gastrointestinal: Soft and nontender. No distention.  Musculoskeletal: No lower extremity tenderness nor edema. No gross deformities of extremities. Neurologic:  Normal speech and language. No gross focal neurologic deficits are appreciated.  Skin:  Skin is warm, dry and intact. No rash noted.   ____________________________________________   LABS (all labs ordered are listed, but only abnormal results are displayed)  Labs Reviewed  BASIC METABOLIC PANEL - Abnormal; Notable for the following components:      Result Value   Glucose, Bld 206 (*)    All other components within normal limits  D-DIMER, QUANTITATIVE (NOT AT [facility]) - Abnormal; Notable for the following components:   D-Dimer, Quant 0.88 (*)    All other components within normal limits  CBC  I-STAT TROPONIN, ED  I-STAT BETA HCG BLOOD, ED ([facility], [facility], [facility] ONLY)   ____________________________________________  EKG   EKG Interpretation  Date/Time:  Monday July 01 2018 08:43:44 EDT Ventricular Rate:  56 PR Interval:    QRS Duration: 86 QT Interval:  421 QTC Calculation: 407 R Axis:   40 Text Interpretation:  Sinus rhythm Low voltage, precordial leads decreased HR since previous Confirmed by Marily Memos [id number]) on 07/01/2018 9:05:19 AM Also confirmed by Marily Memos [phone]), editor Kinder, Tamera Punt ([id number])  on 07/01/2018 2:59:46 PM      ____________________________________________  RADIOLOGY  Dg Chest 2 View  Result Date: 07/01/2018 CLINICAL DATA:  Chest pain and shortness of breath. EXAM: CHEST - 2 VIEW COMPARISON:  None. FINDINGS: The cardiomediastinal silhouette is within normal limits. The lungs are well inflated and clear. There is no evidence of pleural effusion or pneumothorax. No acute osseous abnormality is  identified. IMPRESSION: No active cardiopulmonary disease. Electronically Signed   By: Sebastian Ache M.D.   On: 07/01/2018 09:00   Ct Angio Chest Pe W And/or Wo Contrast  Result Date: 07/01/2018 CLINICAL DATA:  Shortness of breath and chest pain EXAM: CT ANGIOGRAPHY CHEST WITH CONTRAST TECHNIQUE: Multidetector CT imaging of the chest was performed using the standard protocol during bolus administration of intravenous contrast. Multiplanar CT image reconstructions and MIPs were obtained to evaluate the vascular anatomy. CONTRAST:  100 mL ISOVUE-370 IOPAMIDOL (ISOVUE-370) INJECTION 76% COMPARISON:  Chest radiograph July 01, 2018 FINDINGS: Cardiovascular: There is no demonstrable pulmonary embolus. There is no appreciable thoracic aortic aneurysm or dissection. Visualized great vessels appear unremarkable. The right subclavian artery arises distal to the other subclavian arteries and passes posterior to the trachea and  esophagus to reach the right side. There is no pericardial effusion or pericardial thickening evident. Mediastinum/Nodes: Visualized thyroid appears normal. There is no appreciable thoracic adenopathy. No esophageal lesions are evident. Lungs/Pleura: There is no appreciable edema or consolidation. No pleural effusion or pleural thickening evident. Upper Abdomen: There is reflux of contrast into the inferior vena cava and minimally into the hepatic veins. Spleen measures 14.0 x 13.6 x 8.9 cm with a measured splenic volume of 847 cubic cm. No focal splenic lesions are evident. There is hepatic steatosis. Musculoskeletal: There are no blastic or lytic bone lesions. No chest wall lesions evident. Review of the MIP images confirms the above findings. IMPRESSION: 1. No demonstrable pulmonary embolus. No thoracic aortic aneurysm or dissection. Note that there is an apparent origin of the right subclavian artery with the right subclavian artery passing posterior to the esophagus and trachea to reach the  right side. 2.  No lung edema or consolidation. 3.  No evident thoracic aortic aneurysm. 4. Mild reflux into the inferior vena cava and hepatic veins may indicate a degree of increase in right heart pressure. 5.  Splenic enlargement uncertain etiology. 6.  Hepatic steatosis. Electronically Signed   By: Bretta Bang III M.D.   On: 07/01/2018 11:33   ____________________________________________   INITIAL IMPRESSION / ASSESSMENT AND PLAN / ED COURSE  Bradycardia does seem to correlate pretty well with her prednisone dosing. Suspect mild cp/sob is associated with brady rather than the cause of it. ecg with sinus brady here.  With swollen leg, consider dvt/pe. Troponin to eval for heart damage as cause for symptoms as she does have RF's. No medications that would cause brady.   Work-up overall unremarkable.  Patient still asymptomatic in the emergency room.  There are multiple case reports in case studies of prednisone been associated bradycardia in the correlation with her starting it is pretty strong.  Not dangerously low nor she having any associated symptoms with it so she will stop the prednisone and evaluate her pulse at home.  If it improves no need for cardiology follow-up however if it persists or gets lower she will either come here follow-up with cardiology department with a not she has symptoms.  Pertinent labs & imaging results that were available during my care of the patient were reviewed by me and considered in my medical decision making (see chart for details).  ____________________________________________  FINAL CLINICAL IMPRESSION(S) / ED DIAGNOSES  Final diagnoses:  Bradycardia     MEDICATIONS GIVEN DURING THIS VISIT:  Medications  iopamidol (ISOVUE-370) 76 % injection 100 mL (100 mLs Intravenous Contrast Given 07/01/18 1121)     NEW OUTPATIENT MEDICATIONS STARTED DURING THIS VISIT:  Discharge Medication List as of 07/01/2018 12:25 PM      Note:  This note was  prepared with assistance of Dragon voice recognition software. Occasional wrong-word or sound-a-like substitutions may have occurred due to the inherent limitations of voice recognition software.   Marily Memos, MD 07/01/18 1626

## 2018-07-01 NOTE — ED Notes (Signed)
Pt stated that she could not urinate. 

## 2018-07-01 NOTE — ED Triage Notes (Signed)
Pt c/o constant mid chest pain since yesterday, SOB with exertion, and states that according to her EKG on her watch her heart rate will drop below 50.  Pt has asthma and did wake up yesterday wheezing. Also reports that she is on prednisone for her fractured left foot.

## 2018-07-10 ENCOUNTER — Ambulatory Visit (INDEPENDENT_AMBULATORY_CARE_PROVIDER_SITE_OTHER): Payer: 59 | Admitting: Psychiatry

## 2018-07-10 DIAGNOSIS — F411 Generalized anxiety disorder: Secondary | ICD-10-CM

## 2018-07-10 DIAGNOSIS — F3281 Premenstrual dysphoric disorder: Secondary | ICD-10-CM | POA: Diagnosis not present

## 2018-07-10 DIAGNOSIS — F3181 Bipolar II disorder: Secondary | ICD-10-CM | POA: Diagnosis not present

## 2018-07-11 ENCOUNTER — Ambulatory Visit: Payer: Self-pay | Admitting: Psychiatry

## 2018-07-11 ENCOUNTER — Encounter: Payer: Self-pay | Admitting: Psychiatry
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
## 2018-07-12 ENCOUNTER — Other Ambulatory Visit: Payer: Self-pay

## 2018-07-12 MED ORDER — CITALOPRAM HYDROBROMIDE 10 MG PO TABS: ORAL_TABLET | ORAL | 0 refills | Status: DC

## 2018-07-12 NOTE — Addendum Note (Signed)
Addended by: Inocente Salles A on: 07/12/2018 02:24 PM   Modules accepted: Level of Service

## 2018-07-19 ENCOUNTER — Ambulatory Visit: Payer: Self-pay | Admitting: Psychiatry

## 2018-07-22 ENCOUNTER — Encounter: Payer: Self-pay | Admitting: Psychiatry

## 2018-07-22 ENCOUNTER — Ambulatory Visit (INDEPENDENT_AMBULATORY_CARE_PROVIDER_SITE_OTHER): Payer: 59 | Admitting: Psychiatry

## 2018-07-22 VITALS — BP 118/77 | HR 103

## 2018-07-22 DIAGNOSIS — F411 Generalized anxiety disorder: Secondary | ICD-10-CM

## 2018-07-22 DIAGNOSIS — F3181 Bipolar II disorder: Secondary | ICD-10-CM | POA: Diagnosis not present

## 2018-07-22 DIAGNOSIS — F900 Attention-deficit hyperactivity disorder, predominantly inattentive type: Secondary | ICD-10-CM

## 2018-07-22 DIAGNOSIS — F5081 Binge eating disorder: Secondary | ICD-10-CM

## 2018-07-22 DIAGNOSIS — F3281 Premenstrual dysphoric disorder: Secondary | ICD-10-CM | POA: Diagnosis not present

## 2018-07-22 DIAGNOSIS — F5101 Primary insomnia: Secondary | ICD-10-CM

## 2018-07-22 MED ORDER — BUSPIRONE HCL 15 MG PO TABS: 15.0000 mg | ORAL_TABLET | Freq: Two times a day (BID) | ORAL | 1 refills | Status: DC

## 2018-07-22 MED ORDER — LAMOTRIGINE 200 MG PO TABS: 200.0000 mg | ORAL_TABLET | Freq: Every day | ORAL | 1 refills | Status: DC

## 2018-07-22 MED ORDER — LISDEXAMFETAMINE DIMESYLATE 30 MG PO CAPS: 30.0000 mg | ORAL_CAPSULE | Freq: Every day | ORAL | 0 refills | Status: DC

## 2018-07-22 MED ORDER — CITALOPRAM HYDROBROMIDE 10 MG PO TABS: ORAL_TABLET | ORAL | 1 refills | Status: DC

## 2018-07-22 MED ORDER — OXCARBAZEPINE 600 MG PO TABS: 900.0000 mg | ORAL_TABLET | Freq: Every day | ORAL | 1 refills | Status: DC

## 2018-07-22 MED ORDER — HYDROXYZINE HCL 25 MG PO TABS: 25.0000 mg | ORAL_TABLET | Freq: Every day | ORAL | 1 refills | Status: DC

## 2018-07-22 NOTE — Progress Notes (Signed)
Tanya Parker [id number] 05-19-1976 42 y.o.  Subjective:   Patient ID:  Tanya Parker is a 42 y.o. (DOB 1975/11/06) female.  Chief Complaint:  Chief Complaint  Patient presents with  . Follow-up    Anxiety, Depression, binge eating, ADD    HPI Tanya Parker presents to the office today for follow-up of mood and anxiety. Reports that she is feeling more hopeful since meeting with Fred May, LPC. She reports that her medications have been working well. She reports that she and her husband have been doing well. "So many things are changing, and for the good." She reports that she has not noticed any change in binge eating d/o with NAC. Reports 20 lb weight gain since February. She reports that binge eating is worse in the evening. Mood has been "really good actually." Reports that increasing dose of citalopram several days prior to menses has been helpful for PMDD. Denies depressed mood- "none." Denies unexplained irritability. She reports that her anxiety has been well controlled. "I am sleeping so much better." Reports energy and motivation has been good aside from current lethargy secondary to bronchitis. Reports that they are now staying active on the weekends. Concentration has been "great, much better." Denies SI- "none."   Reports that she and several family members have had severe respiratory illnesses.  Medications: I have reviewed the patient's current medications.  Medication Side Effects: None  Allergies: No Known Allergies  Past Medical History:  Diagnosis Date  . Anal fistula   . Anxiety   . Asthma   . Bipolar disorder (HCC)   . Chronic back pain    fell in tub few yrs ago  . Diabetes mellitus without complication (HCC)    type 2  . Hemorrhoids   . Hypertension   . Obesity   . Right tennis elbow 01/03/2018  . Seasonal allergies   . Vitamin D deficiency     Family History  Problem Relation Age of Onset  . Depression Mother   . Anxiety disorder  Father   . Anxiety disorder Daughter   . Sexual abuse Other   . Depression Other   . Anxiety disorder Other   . Paranoid behavior Maternal Grandmother   . Bipolar disorder Cousin   . Anxiety disorder Cousin     Social History   Socioeconomic History  . Marital status: Married    Spouse name: Nida Boatman  . Number of children: 3  . Years of education: 4  . Highest education level: 12th grade  Occupational History  . Not on file  Social Needs  . Financial resource strain: Not on file  . Food insecurity:    Worry: Not on file    Inability: Not on file  . Transportation needs:    Medical: Not on file    Non-medical: Not on file  Tobacco Use  . Smoking status: Former Smoker    Packs/day: 0.25    Years: 2.00    Pack years: 0.50    Types: Cigarettes  . Smokeless tobacco: Never Used  . Tobacco comment: quit sept 2018  Substance and Sexual Activity  . Alcohol use: No    Frequency: Never  . Drug use: No  . Sexual activity: Yes  Lifestyle  . Physical activity:    Days per week: Not on file    Minutes per session: Not on file  . Stress: Very much  Relationships  . Social connections:    Talks on phone: Not on file  Gets together: Not on file    Attends religious service: Not on file    Active member of club or organization: Not on file    Attends meetings of clubs or organizations: Not on file    Relationship status: Not on file  . Intimate partner violence:    Fear of current or ex partner: Not on file    Emotionally abused: Not on file    Physically abused: Not on file    Forced sexual activity: Not on file  Other Topics Concern  . Not on file  Social History Narrative  . Not on file    Past Medical History, Surgical history, Social history, and Family history were reviewed and updated as appropriate.   Please see review of systems for further details on the patient's review from today.   Review of Systems:  Review of Systems  Constitutional: Positive for  chills, fatigue and fever.  HENT: Positive for congestion.   Respiratory: Positive for cough and shortness of breath.   Musculoskeletal: Positive for arthralgias.       Foot fracture.  Psychiatric/Behavioral: Negative for dysphoric mood, sleep disturbance and suicidal ideas.  Reports that she has bronchitis.  Objective:   Physical Exam:  BP 118/77   Pulse (!) 103   LMP 06/26/2018 Comment: negative HCG blood test 07-01-2018  Physical Exam  Constitutional: She is oriented to person, place, and time. She appears well-developed.  Pulmonary/Chest:  Coughing on exam  Musculoskeletal: She exhibits no deformity.  Neurological: She is alert and oriented to person, place, and time. Coordination normal.  Skin: She is diaphoretic.  Psychiatric: She has a normal mood and affect. Her speech is normal and behavior is normal. Judgment and thought content normal. Her mood appears not anxious. Her affect is not angry, not blunt, not labile and not inappropriate. She is not actively hallucinating. Cognition and memory are normal. She does not exhibit a depressed mood. She expresses no homicidal and no suicidal ideation. She expresses no suicidal plans and no homicidal plans. She is attentive.    Lab Review:     Component Value Date/Time   NA 140 07/01/2018 0933   K 4.2 07/01/2018 0933   CL 102 07/01/2018 0933   CO2 28 07/01/2018 0933   GLUCOSE 206 (H) 07/01/2018 0933   BUN 16 07/01/2018 0933   CREATININE 0.69 07/01/2018 0933   CALCIUM 9.0 07/01/2018 0933   GFRNONAA >60 07/01/2018 0933   GFRAA >60 07/01/2018 0933       Component Value Date/Time   WBC 10.0 07/01/2018 0933   RBC 4.63 07/01/2018 0933   HGB 13.4 07/01/2018 0933   HCT 39.5 07/01/2018 0933   PLT 261 07/01/2018 0933   MCV 85.3 07/01/2018 0933   MCH 28.9 07/01/2018 0933   MCHC 33.9 07/01/2018 0933   RDW 13.6 07/01/2018 0933    No results found for: POCLITH, LITHIUM   No results found for: PHENYTOIN, PHENOBARB, VALPROATE,  CBMZ   .res Assessment: Plan:   Patient seen for 30 minutes and greater than 50% of session spent counseling patient regarding treatment of binge eating disorder.  Discussed that Vyvanse is currently the only FDA approved medication for diagnosis of binge eating disorder and that it is also used to treat attention deficit disorder.  We will therefore discontinue Concerta and start Vyvanse 30 mg p.o. every morning to target both attention deficit and binge eating disorder.  Will continue all other medications as prescribed below. Bipolar II disorder (HCC) -  Plan: lamoTRIgine (LAMICTAL) 200 MG tablet, citalopram (CELEXA) 10 MG tablet, oxcarbazepine (TRILEPTAL) 600 MG tablet  Generalized anxiety disorder - Plan: hydrOXYzine (ATARAX/VISTARIL) 25 MG tablet, busPIRone (BUSPAR) 15 MG tablet, citalopram (CELEXA) 10 MG tablet  Premenstrual dysphoric disorder - Plan: citalopram (CELEXA) 10 MG tablet  Primary insomnia - Plan: hydrOXYzine (ATARAX/VISTARIL) 25 MG tablet  Attention deficit hyperactivity disorder (ADHD), predominantly inattentive type - Plan: lisdexamfetamine (VYVANSE) 30 MG capsule  Binge eating disorder - Plan: lisdexamfetamine (VYVANSE) 30 MG capsule  Please see After Visit Summary for patient specific instructions.  Future Appointments  Date Time Provider Department Center  07/24/2018  5:00 PM May, [place], [place] [facility] None  08/23/2018  8:00 AM Corie Chiquito, PMHNP [facility] None    No orders of the defined types were placed in this encounter.     -------------------------------

## 2018-07-24 ENCOUNTER — Ambulatory Visit (INDEPENDENT_AMBULATORY_CARE_PROVIDER_SITE_OTHER): Payer: 59 | Admitting: Psychiatry

## 2018-07-24 DIAGNOSIS — F411 Generalized anxiety disorder: Secondary | ICD-10-CM

## 2018-07-24 DIAGNOSIS — F3181 Bipolar II disorder: Secondary | ICD-10-CM | POA: Diagnosis not present
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
## 2018-07-26 ENCOUNTER — Telehealth: Payer: Self-pay

## 2018-07-26 NOTE — Telephone Encounter (Signed)
PA approved for vyvanse 30mg  through 07/24/2019

## 2018-08-07 ENCOUNTER — Ambulatory Visit (INDEPENDENT_AMBULATORY_CARE_PROVIDER_SITE_OTHER): Payer: 59 | Admitting: Psychiatry

## 2018-08-07 DIAGNOSIS — F411 Generalized anxiety disorder: Secondary | ICD-10-CM | POA: Diagnosis not present
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
## 2018-08-23 ENCOUNTER — Encounter: Payer: Self-pay | Admitting: Psychiatry

## 2018-08-23 ENCOUNTER — Ambulatory Visit (INDEPENDENT_AMBULATORY_CARE_PROVIDER_SITE_OTHER): Payer: 59 | Admitting: Psychiatry

## 2018-08-23 VITALS — BP 103/71 | HR 90

## 2018-08-23 DIAGNOSIS — F900 Attention-deficit hyperactivity disorder, predominantly inattentive type: Secondary | ICD-10-CM

## 2018-08-23 DIAGNOSIS — F5081 Binge eating disorder: Secondary | ICD-10-CM

## 2018-08-23 DIAGNOSIS — F3181 Bipolar II disorder: Secondary | ICD-10-CM

## 2018-08-23 DIAGNOSIS — F411 Generalized anxiety disorder: Secondary | ICD-10-CM | POA: Diagnosis not present

## 2018-08-23 DIAGNOSIS — F3281 Premenstrual dysphoric disorder: Secondary | ICD-10-CM

## 2018-08-23 DIAGNOSIS — F5101 Primary insomnia: Secondary | ICD-10-CM

## 2018-08-23 MED ORDER — LISDEXAMFETAMINE DIMESYLATE 40 MG PO CAPS: 40.0000 mg | ORAL_CAPSULE | ORAL | 0 refills | Status: DC

## 2018-08-23 NOTE — Progress Notes (Signed)
Tanya Parker [id number] Jul 14, 1976 42 y.o.  Subjective:   Patient ID:  Tanya Parker is a 42 y.o. (DOB Jul 14, 1976) female.  Chief Complaint:  Chief Complaint  Patient presents with  . Follow-up    Binge Eating, ADD, Anxiety, Depression    HPI Tanya Parker presents to the office today for follow-up of ADD, Binge Eating, mood, and anxiety. She reports that Vyvanse has been helpful for concentration and focus at work until approximately 1:30-2 pm after taking Vyvanse around 7:30 am. Reports that she is less distracted and able to be more productive. She reports some improvement in binge eating during the day and a slight improvement in binge eating in the evening. She reports that she will have binge eating for a few days and then it will stop.   Denies anxiety. She reports that her mood remains stable. Denies depressed or irritable moods. She reports that she is sleeping well. She denies appetite suppression. She reports energy has been lower the last couple of weeks, particularly by the end of the day. Denies sudden crash in energy. She questions if she may be getting a cold or virus. Reports motivation is adequate. Denies anhedonia and social isolation. Reports that she has been getting out more. Reports that she has been setting appropriate boundaries. She reports impulsive spending at baseline and denies any worsening spending. Denies any manic s/s to include impulsive or risky behavior. Denies SI.  Past medication trials: Citalopram Trileptal Lamictal-helpful for mood Remeron-effective but caused weight gain Seroquel-excessive somnolence Rexulti-increase glucose, helpful for mood and anxiety Xanax Ambien Trazodone Concerta-helpful Adderall-increased heart rate Ritalin Hydroxyzine BuSpar-effective for anxiety  Review of Systems:  Review of Systems  HENT: Positive for congestion, postnasal drip, rhinorrhea, sinus pressure and sinus pain.   Musculoskeletal:  Negative for gait problem.  Neurological: Negative for tremors.  Psychiatric/Behavioral:       Please refer to HPI    Medications: I have reviewed the patient's current medications.  Current Outpatient Medications  Medication Sig Dispense Refill  . busPIRone (BUSPAR) 15 MG tablet Take 1 tablet (15 mg total) by mouth 2 (two) times daily. 180 tablet 1  . canagliflozin (INVOKANA) 100 MG TABS tablet Take 100 mg by mouth daily before breakfast.    . cholecalciferol (VITAMIN D) 1000 units tablet Take 1,000 Units by mouth daily.    . citalopram (CELEXA) 10 MG tablet Take 10 mg by mouth daily and then increase to 20 mg daily for one week before menstrual period starts and then go back to 10 mg daily 135 tablet 1  . hydrOXYzine (ATARAX/VISTARIL) 25 MG tablet Take 1 tablet (25 mg total) by mouth at bedtime. 90 tablet 1  . lamoTRIgine (LAMICTAL) 200 MG tablet Take 1 tablet (200 mg total) by mouth daily. 90 tablet 1  . liraglutide (VICTOZA) 18 MG/3ML SOPN Inject 18 mg into the skin every morning.     . lisdexamfetamine (VYVANSE) 30 MG capsule Take 1 capsule (30 mg total) by mouth daily. 30 capsule 0  . lisinopril-hydrochlorothiazide (PRINZIDE,ZESTORETIC) 20-12.5 MG tablet Take 1 tablet by mouth daily.    . metFORMIN (GLUCOPHAGE) 1000 MG tablet Take 1,000 mg by mouth 2 (two) times daily with a meal.    . oxcarbazepine (TRILEPTAL) 600 MG tablet Take 1.5 tablets (900 mg total) by mouth at bedtime. 135 tablet 1  . rosuvastatin (CRESTOR) 10 MG tablet Take 10 mg by mouth daily.     . VENTOLIN HFA 108 (90 Base) MCG/ACT inhaler Inhale  2 puffs into the lungs every 6 (six) hours as needed for wheezing or shortness of breath.     . dextromethorphan-guaiFENesin (MUCINEX DM) 30-600 MG 12hr tablet Take 1 tablet by mouth 2 (two) times daily.    Marland Kitchen lisdexamfetamine (VYVANSE) 40 MG capsule Take 1 capsule (40 mg total) by mouth every morning. 30 capsule 0   No current facility-administered medications for this visit.      Medication Side Effects: None  Allergies: No Known Allergies  Past Medical History:  Diagnosis Date  . Anal fistula   . Anxiety   . Asthma   . Bipolar disorder (HCC)   . Chronic back pain    fell in tub few yrs ago  . Diabetes mellitus without complication (HCC)    type 2  . Hemorrhoids   . Hypertension   . Obesity   . Right tennis elbow 01/03/2018  . Seasonal allergies   . Vitamin D deficiency     Family History  Problem Relation Age of Onset  . Depression Mother   . Anxiety disorder Father   . Anxiety disorder Daughter   . Sexual abuse Other   . Depression Other   . Anxiety disorder Other   . Paranoid behavior Maternal Grandmother   . Bipolar disorder Cousin   . Anxiety disorder Cousin     Social History   Socioeconomic History  . Marital status: Married    Spouse name: Nida Boatman  . Number of children: 3  . Years of education: 4  . Highest education level: 12th grade  Occupational History  . Not on file  Social Needs  . Financial resource strain: Not on file  . Food insecurity:    Worry: Not on file    Inability: Not on file  . Transportation needs:    Medical: Not on file    Non-medical: Not on file  Tobacco Use  . Smoking status: Former Smoker    Packs/day: 0.25    Years: 2.00    Pack years: 0.50    Types: Cigarettes  . Smokeless tobacco: Never Used  . Tobacco comment: quit sept 2018  Substance and Sexual Activity  . Alcohol use: No    Frequency: Never  . Drug use: No  . Sexual activity: Yes  Lifestyle  . Physical activity:    Days per week: Not on file    Minutes per session: Not on file  . Stress: Very much  Relationships  . Social connections:    Talks on phone: Not on file    Gets together: Not on file    Attends religious service: Not on file    Active member of club or organization: Not on file    Attends meetings of clubs or organizations: Not on file    Relationship status: Not on file  . Intimate partner violence:    Fear  of current or ex partner: Not on file    Emotionally abused: Not on file    Physically abused: Not on file    Forced sexual activity: Not on file  Other Topics Concern  . Not on file  Social History Narrative  . Not on file    Past Medical History, Surgical history, Social history, and Family history were reviewed and updated as appropriate.   Please see review of systems for further details on the patient's review from today.   Objective:   Physical Exam:  BP 103/71   Pulse 90   Physical Exam  Constitutional: She  is oriented to person, place, and time. She appears well-developed. No distress.  Musculoskeletal: She exhibits no deformity.  Neurological: She is alert and oriented to person, place, and time. Coordination normal.  Psychiatric: She has a normal mood and affect. Her speech is normal and behavior is normal. Judgment and thought content normal. Her mood appears not anxious. Her affect is not angry, not blunt, not labile and not inappropriate. Cognition and memory are normal. She does not exhibit a depressed mood. She expresses no homicidal and no suicidal ideation. She expresses no suicidal plans and no homicidal plans.  Insight intact. No auditory or visual hallucinations. No delusions.     Lab Review:     Component Value Date/Time   NA 140 07/01/2018 0933   K 4.2 07/01/2018 0933   CL 102 07/01/2018 0933   CO2 28 07/01/2018 0933   GLUCOSE 206 (H) 07/01/2018 0933   BUN 16 07/01/2018 0933   CREATININE 0.69 07/01/2018 0933   CALCIUM 9.0 07/01/2018 0933   GFRNONAA >60 07/01/2018 0933   GFRAA >60 07/01/2018 0933       Component Value Date/Time   WBC 10.0 07/01/2018 0933   RBC 4.63 07/01/2018 0933   HGB 13.4 07/01/2018 0933   HCT 39.5 07/01/2018 0933   PLT 261 07/01/2018 0933   MCV 85.3 07/01/2018 0933   MCH 28.9 07/01/2018 0933   MCHC 33.9 07/01/2018 0933   RDW 13.6 07/01/2018 0933    No results found for: POCLITH, LITHIUM   No results found for:  PHENYTOIN, PHENOBARB, VALPROATE, CBMZ   .res Assessment: Plan:   Pt seen for 30 minutes and greater than 50% of visit spent counseling pt re: potential benefits, risks, and side effects of increasing Vyvanse to 40 mg po qd since she has had a partial response to 30 mg dose. Discussed that increased dose may further decrease binge eating and be more effective for concentration throughout the day. Pt agrees with increase in Vyvanse to 40 mg po qd for ADD and Binge Eating D/O. Pt to call with any adverse effects. Will continue Trileptal and Lamictal for mood. Continue Celexa for mood and anxiety. Continue Buspar for anxiety. Continue Hydroxyzine for insomnia.  Binge eating disorder - Plan: lisdexamfetamine (VYVANSE) 40 MG capsule  Attention deficit hyperactivity disorder (ADHD), predominantly inattentive type - Plan: lisdexamfetamine (VYVANSE) 40 MG capsule  Bipolar II disorder (HCC)  Generalized anxiety disorder  Premenstrual dysphoric disorder  Primary insomnia  Please see After Visit Summary for patient specific instructions.  Future Appointments  Date Time Provider Department Center  09/20/2018  8:30 AM Corie Chiquito, PMHNP [facility] None  10/25/2018  3:00 PM May, [place], [facility] None  10/31/2018  5:00 PM May, [place], [place] [facility] None  11/06/2018  5:00 PM May, [place], [place] [facility] None  11/12/2018  5:00 PM May, [place], [facility] None    No orders of the defined types were placed in this encounter.     -------------------------------Patient seen for 30 minutes and greater than 50% of session spent counseling patient regarding treatment of binge eating disorder.  Discussed that Vyvanse is currently the only FDA approved medication for diagnosis of binge eating disorder and that it is also used to treat attention deficit disorder.  We will therefore discontinue Concerta and start Vyvanse 30 mg p.o. every morning to target both attention deficit and binge eating disorder.  Will  continue all other medications as prescribed below.

## 2018-08-28 ENCOUNTER — Encounter: Payer: Self-pay | Admitting: Psychiatry

## 2018-08-28 ENCOUNTER — Ambulatory Visit (INDEPENDENT_AMBULATORY_CARE_PROVIDER_SITE_OTHER): Payer: 59 | Admitting: Psychiatry

## 2018-08-28 DIAGNOSIS — F411 Generalized anxiety disorder: Secondary | ICD-10-CM | POA: Diagnosis not present
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
## 2018-09-08 ENCOUNTER — Encounter: Payer: Self-pay | Admitting: Emergency Medicine

## 2018-09-08 DIAGNOSIS — F411 Generalized anxiety disorder: Secondary | ICD-10-CM | POA: Insufficient documentation

## 2018-09-08 DIAGNOSIS — F988 Other specified behavioral and emotional disorders with onset usually occurring in childhood and adolescence: Secondary | ICD-10-CM

## 2018-09-08 DIAGNOSIS — F3281 Premenstrual dysphoric disorder: Secondary | ICD-10-CM | POA: Insufficient documentation

## 2018-09-08 DIAGNOSIS — G4700 Insomnia, unspecified: Secondary | ICD-10-CM | POA: Insufficient documentation

## 2018-09-08 DIAGNOSIS — F3181 Bipolar II disorder: Secondary | ICD-10-CM

## 2018-09-18 ENCOUNTER — Telehealth: Payer: Self-pay | Admitting: Psychiatry

## 2018-09-18 DIAGNOSIS — F411 Generalized anxiety disorder: Secondary | ICD-10-CM

## 2018-09-18 DIAGNOSIS — F5101 Primary insomnia: Secondary | ICD-10-CM

## 2018-09-18 MED ORDER — HYDROXYZINE HCL 25 MG PO TABS: ORAL_TABLET | ORAL | 1 refills | Status: DC

## 2018-09-18 NOTE — Telephone Encounter (Signed)
Patient reports that she takes 2 hydroxyzine at bedtime and most recent prescription was for quantity of #30.  [facility]  Prescription sent.

## 2018-09-19 ENCOUNTER — Ambulatory Visit: Payer: 59 | Admitting: Psychiatry

## 2018-09-20 ENCOUNTER — Ambulatory Visit (INDEPENDENT_AMBULATORY_CARE_PROVIDER_SITE_OTHER): Payer: 59 | Admitting: Psychiatry

## 2018-09-20 ENCOUNTER — Encounter: Payer: Self-pay | Admitting: Psychiatry

## 2018-09-20 DIAGNOSIS — F5081 Binge eating disorder: Secondary | ICD-10-CM

## 2018-09-20 DIAGNOSIS — F900 Attention-deficit hyperactivity disorder, predominantly inattentive type: Secondary | ICD-10-CM

## 2018-09-20 DIAGNOSIS — F3181 Bipolar II disorder: Secondary | ICD-10-CM | POA: Diagnosis not present

## 2018-09-20 DIAGNOSIS — F50819 Binge eating disorder, unspecified: Secondary | ICD-10-CM

## 2018-09-20 DIAGNOSIS — F411 Generalized anxiety disorder: Secondary | ICD-10-CM

## 2018-09-20 DIAGNOSIS — F3281 Premenstrual dysphoric disorder: Secondary | ICD-10-CM

## 2018-09-20 MED ORDER — BUSPIRONE HCL 15 MG PO TABS: 15.0000 mg | ORAL_TABLET | Freq: Two times a day (BID) | ORAL | 1 refills | Status: DC

## 2018-09-20 MED ORDER — OXCARBAZEPINE 600 MG PO TABS: 900.0000 mg | ORAL_TABLET | Freq: Every day | ORAL | 1 refills | Status: DC

## 2018-09-20 MED ORDER — HYDROXYZINE PAMOATE 50 MG PO CAPS: 50.0000 mg | ORAL_CAPSULE | Freq: Every day | ORAL | 0 refills | Status: DC

## 2018-09-20 MED ORDER — LISDEXAMFETAMINE DIMESYLATE 40 MG PO CAPS: 40.0000 mg | ORAL_CAPSULE | ORAL | 0 refills | Status: DC

## 2018-09-20 MED ORDER — LAMOTRIGINE 200 MG PO TABS: 200.0000 mg | ORAL_TABLET | Freq: Every day | ORAL | 1 refills | Status: DC

## 2018-09-20 MED ORDER — CITALOPRAM HYDROBROMIDE 10 MG PO TABS: ORAL_TABLET | ORAL | 1 refills | Status: DC

## 2018-09-20 NOTE — Progress Notes (Signed)
Tanya Parker [id number] November 03, 1975 42 y.o.  Subjective:   Patient ID:  Tanya Parker is a 42 y.o. (DOB 1976/01/12) female.  Chief Complaint:  Chief Complaint  Patient presents with  . Other    Eating disorder  . ADD  . Anxiety  . Depression    HPI Tanya Parker presents to the office today for follow-up of binge eating d/o. Reports binge eating has improved with Vyvanse. Reports some cravings for binge eating at night, especially when watching TV or a movie. Reports that she is more talkative in the morning for about an hour after taking Vyvanse and then this subsides. She reports that she notices increase in binge eating around menses.  Reports that she had recent "blow up" and this was the first time she had severe agitation in a long period of time and attributes this to the set of circumstances and triggers. Denies any other episodes of anxiety or irritability. Reports anxiety remains controlled. Denies excessive spending or shopping. Reports that she has made lists of what she plans to buy and is using coupons and savings offer. Reports that her mood has been stable and denies depressed mood. Sleep has improved. Appetite- "overall it is much better." Reports that she has lost some weight. Reports that she continues to have some difficulty with concentration at work and that her supervisor talked with her about slowing down and being more careful. Has been using strategies to improve work performance, such as check lists and minimizing distractions (ie., putting phone on do not disturb, etc). Reports improved energy and motivation. Denies SI.   Reports that relationship with husband is "the best we have ever been."   Past medication trials: Citalopram Trileptal Lamictal-helpful for mood Remeron-effective but caused weight gain Seroquel-excessive somnolence Rexulti-increase glucose, helpful for mood and  anxiety Xanax Ambien Trazodone Concerta-helpful Adderall-increased heart rate Ritalin Hydroxyzine BuSpar-effective for anxiety  Review of Systems:  Review of Systems  Cardiovascular: Negative for palpitations.  Gastrointestinal: Negative for constipation.       Reports that she may have start of another anal fistula.   Genitourinary: Positive for menstrual problem.  Musculoskeletal: Negative for gait problem.  Neurological: Negative for tremors.  Psychiatric/Behavioral:       Please refer to HPI    Medications: I have reviewed the patient's current medications.  Current Outpatient Medications  Medication Sig Dispense Refill  . busPIRone (BUSPAR) 15 MG tablet Take 1 tablet (15 mg total) by mouth 2 (two) times daily. 180 tablet 1  . canagliflozin (INVOKANA) 100 MG TABS tablet Take 100 mg by mouth daily before breakfast.    . cholecalciferol (VITAMIN D) 1000 units tablet Take 1,000 Units by mouth daily.    . citalopram (CELEXA) 10 MG tablet Take 10 mg by mouth daily and then increase to 20 mg daily for one week before menstrual period starts and then go back to 10 mg daily 135 tablet 1  . lamoTRIgine (LAMICTAL) 200 MG tablet Take 1 tablet (200 mg total) by mouth daily. 90 tablet 1  . liraglutide (VICTOZA) 18 MG/3ML SOPN Inject 18 mg into the skin every morning.     . lisdexamfetamine (VYVANSE) 40 MG capsule Take 1 capsule (40 mg total) by mouth every morning. 90 capsule 0  . lisinopril-hydrochlorothiazide (PRINZIDE,ZESTORETIC) 20-12.5 MG tablet Take 1 tablet by mouth daily.    . metFORMIN (GLUCOPHAGE) 1000 MG tablet Take 1,000 mg by mouth 2 (two) times daily with a meal.    . oxcarbazepine (  TRILEPTAL) 600 MG tablet Take 1.5 tablets (900 mg total) by mouth at bedtime. 135 tablet 1  . rosuvastatin (CRESTOR) 10 MG tablet Take 10 mg by mouth daily.     . VENTOLIN HFA 108 (90 Base) MCG/ACT inhaler Inhale 2 puffs into the lungs every 6 (six) hours as needed for wheezing or shortness of  breath.     . hydrOXYzine (VISTARIL) 50 MG capsule Take 1 capsule (50 mg total) by mouth at bedtime. 90 capsule 0  . lisdexamfetamine (VYVANSE) 30 MG capsule Take 1 capsule (30 mg total) by mouth daily. (Patient not taking: Reported on 09/20/2018) 30 capsule 0   No current facility-administered medications for this visit.     Medication Side Effects: None  Allergies: No Known Allergies  Past Medical History:  Diagnosis Date  . Anal fistula   . Anxiety   . Asthma   . Bipolar disorder (HCC)   . Chronic back pain    fell in tub few yrs ago  . Diabetes mellitus without complication (HCC)    type 2  . Hemorrhoids   . Hypertension   . Obesity   . Right tennis elbow 01/03/2018  . Seasonal allergies   . Vitamin D deficiency     Family History  Problem Relation Age of Onset  . Depression Mother   . Anxiety disorder Father   . Anxiety disorder Daughter   . Sexual abuse Other   . Depression Other   . Anxiety disorder Other   . Paranoid behavior Maternal Grandmother   . Bipolar disorder Cousin   . Anxiety disorder Cousin     Social History   Socioeconomic History  . Marital status: Married    Spouse name: Nida BoatmanBrad  . Number of children: 3  . Years of education: 4  . Highest education level: 12th grade  Occupational History  . Not on file  Social Needs  . Financial resource strain: Not on file  . Food insecurity:    Worry: Not on file    Inability: Not on file  . Transportation needs:    Medical: Not on file    Non-medical: Not on file  Tobacco Use  . Smoking status: Former Smoker    Packs/day: 0.25    Years: 2.00    Pack years: 0.50    Types: Cigarettes  . Smokeless tobacco: Never Used  . Tobacco comment: quit sept 2018  Substance and Sexual Activity  . Alcohol use: No    Frequency: Never  . Drug use: No  . Sexual activity: Yes    Partners: Male  Lifestyle  . Physical activity:    Days per week: Not on file    Minutes per session: Not on file  . Stress:  Very much  Relationships  . Social connections:    Talks on phone: Not on file    Gets together: Not on file    Attends religious service: Not on file    Active member of club or organization: Not on file    Attends meetings of clubs or organizations: Not on file    Relationship status: Not on file  . Intimate partner violence:    Fear of current or ex partner: Not on file    Emotionally abused: Not on file    Physically abused: Not on file    Forced sexual activity: Not on file  Other Topics Concern  . Not on file  Social History Narrative  . Not on file  Past Medical History, Surgical history, Social history, and Family history were reviewed and updated as appropriate.   Please see review of systems for further details on the patient's review from today.   Objective:   Physical Exam:  BP 121/83   Pulse 91   Physical Exam Constitutional:      General: She is not in acute distress.    Appearance: She is well-developed.  Musculoskeletal:        General: No deformity.  Neurological:     Mental Status: She is alert and oriented to person, place, and time.     Coordination: Coordination normal.  Psychiatric:        Mood and Affect: Mood is not anxious or depressed. Affect is not labile, blunt, angry or inappropriate.        Speech: Speech normal.        Behavior: Behavior normal.        Thought Content: Thought content normal. Thought content does not include homicidal or suicidal ideation. Thought content does not include homicidal or suicidal plan.        Judgment: Judgment normal.     Comments: Insight intact. No auditory or visual hallucinations. No delusions.      Lab Review:     Component Value Date/Time   NA 140 07/01/2018 0933   K 4.2 07/01/2018 0933   CL 102 07/01/2018 0933   CO2 28 07/01/2018 0933   GLUCOSE 206 (H) 07/01/2018 0933   BUN 16 07/01/2018 0933   CREATININE 0.69 07/01/2018 0933   CALCIUM 9.0 07/01/2018 0933   GFRNONAA >60 07/01/2018 0933    GFRAA >60 07/01/2018 0933       Component Value Date/Time   WBC 10.0 07/01/2018 0933   RBC 4.63 07/01/2018 0933   HGB 13.4 07/01/2018 0933   HCT 39.5 07/01/2018 0933   PLT 261 07/01/2018 0933   MCV 85.3 07/01/2018 0933   MCH 28.9 07/01/2018 0933   MCHC 33.9 07/01/2018 0933   RDW 13.6 07/01/2018 0933    No results found for: POCLITH, LITHIUM   No results found for: PHENYTOIN, PHENOBARB, VALPROATE, CBMZ   .res Assessment: Plan:   Continue Vyvanse 40 mg po q am for ADHD and binge eating d/o. Continue Trileptal and Lamictal for mood. Continue Celexa for mood and anxiety. Continue Buspar for anxiety. Continue Hydroxyzine for insomnia. Attention deficit hyperactivity disorder (ADHD), predominantly inattentive type - Improved - Plan: lisdexamfetamine (VYVANSE) 40 MG capsule  Binge eating disorder - Chronic, improved - Plan: lisdexamfetamine (VYVANSE) 40 MG capsule  Generalized anxiety disorder - stable - Plan: hydrOXYzine (VISTARIL) 50 MG capsule, busPIRone (BUSPAR) 15 MG tablet, citalopram (CELEXA) 10 MG tablet  Bipolar II disorder (HCC) - stable - Plan: citalopram (CELEXA) 10 MG tablet, lamoTRIgine (LAMICTAL) 200 MG tablet, oxcarbazepine (TRILEPTAL) 600 MG tablet  Premenstrual dysphoric disorder - Improved - Plan: citalopram (CELEXA) 10 MG tablet  Please see After Visit Summary for patient specific instructions.  Future Appointments  Date Time Provider Department Center  10/25/2018  3:00 PM May, [place], [place] [facility] None  10/31/2018  5:00 PM May, [place], [place] [facility] None  11/06/2018  5:00 PM May, [place], [place] [facility] None  11/12/2018  5:00 PM May, [place], [place] CP-CP None  12/20/2018  8:30 AM Corie Chiquito, PMHNP CP-CP None    No orders of the defined types were placed in this encounter.     -------------------------------

## 2018-10-08 ENCOUNTER — Encounter

## 2018-10-23 ENCOUNTER — Telehealth: Payer: Self-pay | Admitting: Psychiatry

## 2018-10-23 DIAGNOSIS — F900 Attention-deficit hyperactivity disorder, predominantly inattentive type: Secondary | ICD-10-CM

## 2018-10-23 DIAGNOSIS — F5081 Binge eating disorder: Secondary | ICD-10-CM

## 2018-10-23 MED ORDER — LISDEXAMFETAMINE DIMESYLATE 40 MG PO CAPS: 40.0000 mg | ORAL_CAPSULE | ORAL | 0 refills | Status: DC

## 2018-10-23 NOTE — Telephone Encounter (Signed)
Tanya Parker called to request refill on her Vyvanse.  Next appt. 12/20/18.  Send to [facility] on [address] (by [place]

## 2018-10-25 ENCOUNTER — Ambulatory Visit: Payer: 59 | Admitting: Psychiatry

## 2018-10-31 ENCOUNTER — Ambulatory Visit: Payer: 59 | Admitting: Psychiatry

## 2018-11-04 DIAGNOSIS — R05 Cough: Secondary | ICD-10-CM | POA: Diagnosis not present

## 2018-11-04 DIAGNOSIS — R062 Wheezing: Secondary | ICD-10-CM | POA: Diagnosis not present

## 2018-11-04 DIAGNOSIS — J988 Other specified respiratory disorders: Secondary | ICD-10-CM | POA: Diagnosis not present

## 2018-11-06 ENCOUNTER — Ambulatory Visit: Payer: 59 | Admitting: Psychiatry

## 2018-11-12 ENCOUNTER — Ambulatory Visit (INDEPENDENT_AMBULATORY_CARE_PROVIDER_SITE_OTHER): Payer: 59 | Admitting: Psychiatry

## 2018-11-12 ENCOUNTER — Encounter: Payer: Self-pay | Admitting: Psychiatry

## 2018-11-12 DIAGNOSIS — F411 Generalized anxiety disorder: Secondary | ICD-10-CM | POA: Diagnosis not present

## 2018-11-12 NOTE — Progress Notes (Signed)
      [facility] Counselor/Therapist Progress Note  Patient ID: Tanya Parker, MRN: [id number],    Date: 11/12/2018  Time Spent: 45 minutes   Treatment Type: Individual Therapy  Reported Symptoms: Anxious Mood  Mental Status Exam:  Appearance:   Casual     Behavior:  Appropriate  Motor:  Normal  Speech/Language:   Clear and Coherent  Affect:  Appropriate  Mood:  anxious  Thought process:  normal  Thought content:    WNL  Sensory/Perceptual disturbances:    WNL  Orientation:  oriented to person, place, time/date and situation  Attention:  Good  Concentration:  Good  Memory:  WNL  Fund of knowledge:   Good  Insight:    Good  Judgment:   Good  Impulse Control:  Good   Risk Assessment: Danger to Self:  No Self-injurious Behavior: No Danger to Others: No Duty to Warn:no Physical Aggression / Violence:No  Access to Firearms a concern: No  Gang Involvement:No   Subjective: The client reports that she has lost 20 pounds since her last session.  She continues to take the Vyvanse as prescribed.  She has had no binge eating episodes.  Her relationship with her husband has improved.  She has discontinued the affair of which she had been having and had last contact in December. Today the client reports little to no anxiety.  She and her husband have been attending church regularly.  She says her spiritual life has helped her tremendously.  She feels freed up from her eating disorder and free in her relationship with her husband.  The client has no negative cognitions that bother her.  She feels the work we have done has worked well for her.  She agrees to follow-up on an as-needed basis.  Interventions: Solution-Oriented/Positive Psychology, Eye Movement Desensitization and Reprocessing (EMDR) and Insight-Oriented  Diagnosis:   ICD-10-CM   1. Generalized anxiety disorder F41.1     Plan: Exercise, follow-up with Corie Chiquito, NP, positive self talk.  Gelene Mink Keenan [place],  [place]

## 2018-11-29 ENCOUNTER — Telehealth: Payer: Self-pay | Admitting: Psychiatry

## 2018-11-29 DIAGNOSIS — F5081 Binge eating disorder: Secondary | ICD-10-CM

## 2018-11-29 DIAGNOSIS — F900 Attention-deficit hyperactivity disorder, predominantly inattentive type: Secondary | ICD-10-CM

## 2018-11-29 NOTE — Telephone Encounter (Signed)
Tanya Parker called to request refill on her Vyvanse.  Next appt. 12/20/18.  Send to [facility] by [place] [employer].

## 2018-12-02 MED ORDER — LISDEXAMFETAMINE DIMESYLATE 40 MG PO CAPS: 40.0000 mg | ORAL_CAPSULE | ORAL | 0 refills | Status: DC

## 2018-12-20 ENCOUNTER — Ambulatory Visit: Payer: 59 | Admitting: Psychiatry

## 2019-01-03 ENCOUNTER — Ambulatory Visit: Payer: 59 | Admitting: Psychiatry

## 2019-01-06 ENCOUNTER — Other Ambulatory Visit: Payer: Self-pay

## 2019-01-06 ENCOUNTER — Telehealth: Payer: Self-pay | Admitting: Psychiatry

## 2019-01-06 DIAGNOSIS — F900 Attention-deficit hyperactivity disorder, predominantly inattentive type: Secondary | ICD-10-CM

## 2019-01-06 DIAGNOSIS — F5081 Binge eating disorder: Secondary | ICD-10-CM

## 2019-01-06 MED ORDER — LISDEXAMFETAMINE DIMESYLATE 40 MG PO CAPS: 40.0000 mg | ORAL_CAPSULE | ORAL | 0 refills | Status: DC

## 2019-01-06 NOTE — Telephone Encounter (Signed)
Submitted to provider  

## 2019-01-06 NOTE — Telephone Encounter (Signed)
Please RF pt Rx of Vyvanse. Use pharmacy on file.

## 2019-02-04 ENCOUNTER — Other Ambulatory Visit: Payer: Self-pay

## 2019-02-04 ENCOUNTER — Telehealth: Payer: Self-pay | Admitting: Psychiatry

## 2019-02-04 DIAGNOSIS — F900 Attention-deficit hyperactivity disorder, predominantly inattentive type: Secondary | ICD-10-CM

## 2019-02-04 DIAGNOSIS — F5081 Binge eating disorder: Secondary | ICD-10-CM

## 2019-02-04 MED ORDER — LISDEXAMFETAMINE DIMESYLATE 40 MG PO CAPS: 40.0000 mg | ORAL_CAPSULE | ORAL | 0 refills | Status: DC

## 2019-02-04 NOTE — Telephone Encounter (Signed)
Pt needs rx for Vyvanse sent to [facility] on [address].

## 2019-02-21 ENCOUNTER — Other Ambulatory Visit: Payer: Self-pay

## 2019-02-21 ENCOUNTER — Ambulatory Visit (INDEPENDENT_AMBULATORY_CARE_PROVIDER_SITE_OTHER): Payer: 59 | Admitting: Psychiatry

## 2019-02-21 DIAGNOSIS — F3281 Premenstrual dysphoric disorder: Secondary | ICD-10-CM

## 2019-02-21 DIAGNOSIS — F900 Attention-deficit hyperactivity disorder, predominantly inattentive type: Secondary | ICD-10-CM

## 2019-02-21 DIAGNOSIS — F5081 Binge eating disorder: Secondary | ICD-10-CM

## 2019-02-21 DIAGNOSIS — F411 Generalized anxiety disorder: Secondary | ICD-10-CM | POA: Diagnosis not present

## 2019-02-21 DIAGNOSIS — F3181 Bipolar II disorder: Secondary | ICD-10-CM

## 2019-02-21 MED ORDER — AMPHETAMINE-DEXTROAMPHET ER 10 MG PO CP24: 10.0000 mg | ORAL_CAPSULE | Freq: Every day | ORAL | 0 refills | Status: DC

## 2019-02-21 MED ORDER — LISDEXAMFETAMINE DIMESYLATE 40 MG PO CAPS: 40.0000 mg | ORAL_CAPSULE | ORAL | 0 refills | Status: DC

## 2019-02-21 MED ORDER — OXCARBAZEPINE 600 MG PO TABS: 900.0000 mg | ORAL_TABLET | Freq: Every day | ORAL | 1 refills | Status: DC

## 2019-02-21 MED ORDER — CITALOPRAM HYDROBROMIDE 10 MG PO TABS: ORAL_TABLET | ORAL | 1 refills | Status: DC

## 2019-02-21 MED ORDER — LAMOTRIGINE 150 MG PO TABS: 150.0000 mg | ORAL_TABLET | Freq: Every day | ORAL | 1 refills | Status: DC

## 2019-02-21 MED ORDER — BUSPIRONE HCL 15 MG PO TABS: 15.0000 mg | ORAL_TABLET | Freq: Two times a day (BID) | ORAL | 1 refills | Status: DC

## 2019-02-21 NOTE — Progress Notes (Signed)
Tanya Parker [id number] Mar 13, 1976 43 y.o.  Virtual Visit via Telephone Note  I connected with pt on 02/21/19 at 11:30 AM EDT by telephone and verified that I am speaking with the correct person using two identifiers.   I discussed the limitations, risks, security and privacy concerns of performing an evaluation and management service by telephone and the availability of in person appointments. I also discussed with the patient that there may be a patient responsible charge related to this service. The patient expressed understanding and agreed to proceed.   I discussed the assessment and treatment plan with the patient. The patient was provided an opportunity to ask questions and all were answered. The patient agreed with the plan and demonstrated an understanding of the instructions.   The patient was advised to call back or seek an in-person evaluation if the symptoms worsen or if the condition fails to improve as anticipated.  I provided 30 minutes of non-face-to-face time during this encounter.  The patient was located at home.  The provider was located at home.   Tanya Parker, PMHNP   Subjective:   Patient ID:  Tanya Parker is a 43 y.o. (DOB 09/17/1976) female.  Chief Complaint:  Chief Complaint  Patient presents with  . ADD  . Follow-up    h/o anxiety and mood disturbance    HPI AILY GOTTO presents for follow-up of mood, anxiety, and ADD.  She reports that her work was reduced to 2 days a week during the pandemic. Reports work schedule will return to 4 days a week soon.    She reports that her mood has been good. She reports that she increases Citalopram around menses when she notices some mild dysphoric mood. Denies depressed mood otherwise. Denies manic s/s. She reports that she has been trying to stay active and do home improvement projects. Denies any impulsive or risky behaviors. Reports that she sold some of their darker furnishings and replaced it  with brighter colors after getting a new sectional that someone was giving away. She reports that she had a brief "spending spree" when she was buying bargain items. Denies any recent excessive spending. Denies excessive talkativeness. Denies elevated mood. Denies racing thoughts.   She reports that her anxiety has been controlled. Reports that she is cleaning obsessively. She reports some binge eating at night and not during the day. "I think it's just a habit." Reports some intentional weight loss. She reports "tossing and turning every night" and waking up around 3 am and cannot return to sleep for 1-1.5 hours. Reports sleeping about 3-4 hours. She reports that her energy and motivation have been good and she is accomplishing a lot of things. Denies SI.   Reports that she is having difficulty at work and is on a 90-day probation. "I cannot get it together. I feel like I am drowning." She reports "I feel like I am all over the place." Describes starting one task and starting another before completing the first task. Misplacing paper work. Reports that she has difficulty turning things in on time and is overlooking certain details. Reports that she has been having difficulty making decisions. She reports that she is having occasional word finding errors and sometimes starts a sentence and does not finish her thought. She reports difficulty prioritizing tasks and tried using a list to write out what she needs to do. Reports that she has to check her work multiple times and continues to miss things. She reports that she  has anxiety about not being able to be successful at work and losing her job. She reports that Vyvanse around 7:15-7:30 am seems to no longer be as effective after 1 pm.   She reports that she saw a significant improvement with therapy with Fred May, LPC.  She reports that a friend of 20 years ago died from drowning a week ago. Husband also had a friend die from COVID 26. Reports that she and  her husband's relationship has been good.   Past medication trials:  Citalopram Trileptal Lamictal-helpful for mood Remeron-effective but caused weight gain Seroquel-excessive somnolence Rexulti-increase glucose, helpful for mood and anxiety Xanax Ambien Trazodone Concerta-helpful Adderall-increased heart rate Ritalin Hydroxyzine BuSpar-effective for anxiety  Review of Systems:  Review of Systems  Musculoskeletal: Positive for arthralgias. Negative for gait problem.  Neurological: Negative for tremors.  Psychiatric/Behavioral:       Please refer to HPI    Medications: I have reviewed the patient's current medications.  Current Outpatient Medications  Medication Sig Dispense Refill  . busPIRone (BUSPAR) 15 MG tablet Take 1 tablet (15 mg total) by mouth 2 (two) times daily. 180 tablet 1  . canagliflozin (INVOKANA) 100 MG TABS tablet Take 100 mg by mouth daily before breakfast.    . cholecalciferol (VITAMIN D) 1000 units tablet Take 1,000 Units by mouth daily.    . citalopram (CELEXA) 10 MG tablet Take 10 mg by mouth daily and then increase to 20 mg daily for one week before menstrual period starts and then go back to 10 mg daily 135 tablet 1  . hydrOXYzine (ATARAX/VISTARIL) 50 MG tablet Take 50 mg by mouth 3 (three) times daily as needed.    . liraglutide (VICTOZA) 18 MG/3ML SOPN Inject 18 mg into the skin every morning.     Melene Muller ON 03/04/2019] lisdexamfetamine (VYVANSE) 40 MG capsule Take 1 capsule (40 mg total) by mouth every morning. 30 capsule 0  . lisinopril-hydrochlorothiazide (PRINZIDE,ZESTORETIC) 20-12.5 MG tablet Take 1 tablet by mouth daily.    . metFORMIN (GLUCOPHAGE) 1000 MG tablet Take 1,000 mg by mouth 2 (two) times daily with a meal.    . rosuvastatin (CRESTOR) 10 MG tablet Take 10 mg by mouth daily.     . VENTOLIN HFA 108 (90 Base) MCG/ACT inhaler Inhale 2 puffs into the lungs every 6 (six) hours as needed for wheezing or shortness of breath.     .  amphetamine-dextroamphetamine (ADDERALL XR) 10 MG 24 hr capsule Take 1 capsule (10 mg total) by mouth daily at 12 noon for 30 days. 30 capsule 0  . lamoTRIgine (LAMICTAL) 150 MG tablet Take 1 tablet (150 mg total) by mouth daily. 30 tablet 1  . oxcarbazepine (TRILEPTAL) 600 MG tablet Take 1.5 tablets (900 mg total) by mouth at bedtime. 135 tablet 1   No current facility-administered medications for this visit.     Medication Side Effects: Other: Questions if word finding errors are due to med side effect  Allergies: No Known Allergies  Past Medical History:  Diagnosis Date  . Anal fistula   . Anxiety   . Asthma   . Bipolar disorder (HCC)   . Chronic back pain    fell in tub few yrs ago  . Diabetes mellitus without complication (HCC)    type 2  . Hemorrhoids   . Hypertension   . Obesity   . Right tennis elbow 01/03/2018  . Seasonal allergies   . Vitamin D deficiency     Family History  Problem Relation Age of Onset  . Depression Mother   . Anxiety disorder Father   . Anxiety disorder Daughter   . Sexual abuse Other   . Depression Other   . Anxiety disorder Other   . Paranoid behavior Maternal Grandmother   . Bipolar disorder Cousin   . Anxiety disorder Cousin     Social History   Socioeconomic History  . Marital status: Married    Spouse name: Nida BoatmanBrad  . Number of children: 3  . Years of education: 4  . Highest education level: 12th grade  Occupational History  . Not on file  Social Needs  . Financial resource strain: Not on file  . Food insecurity:    Worry: Not on file    Inability: Not on file  . Transportation needs:    Medical: Not on file    Non-medical: Not on file  Tobacco Use  . Smoking status: Former Smoker    Packs/day: 0.25    Years: 2.00    Pack years: 0.50    Types: Cigarettes  . Smokeless tobacco: Never Used  . Tobacco comment: quit sept 2018  Substance and Sexual Activity  . Alcohol use: No    Frequency: Never  . Drug use: No  .  Sexual activity: Yes    Partners: Male  Lifestyle  . Physical activity:    Days per week: Not on file    Minutes per session: Not on file  . Stress: Very much  Relationships  . Social connections:    Talks on phone: Not on file    Gets together: Not on file    Attends religious service: Not on file    Active member of club or organization: Not on file    Attends meetings of clubs or organizations: Not on file    Relationship status: Not on file  . Intimate partner violence:    Fear of current or ex partner: Not on file    Emotionally abused: Not on file    Physically abused: Not on file    Forced sexual activity: Not on file  Other Topics Concern  . Not on file  Social History Narrative  . Not on file    Past Medical History, Surgical history, Social history, and Family history were reviewed and updated as appropriate.   Please see review of systems for further details on the patient's review from today.   Objective:   Physical Exam:  There were no vitals taken for this visit.  Physical Exam Neurological:     Mental Status: She is alert and oriented to person, place, and time.     Cranial Nerves: No dysarthria.  Psychiatric:        Attention and Perception: She is inattentive.        Speech: Speech normal.        Behavior: Behavior is cooperative.        Thought Content: Thought content normal. Thought content is not paranoid or delusional. Thought content does not include homicidal or suicidal ideation. Thought content does not include homicidal or suicidal plan.        Cognition and Memory: Cognition and memory normal.        Judgment: Judgment normal.     Comments: Mood presents as mildly elevated.     Lab Review:     Component Value Date/Time   NA 140 07/01/2018 0933   K 4.2 07/01/2018 0933   CL 102 07/01/2018 0933   CO2 28  07/01/2018 0933   GLUCOSE 206 (H) 07/01/2018 0933   BUN 16 07/01/2018 0933   CREATININE 0.69 07/01/2018 0933   CALCIUM 9.0  07/01/2018 0933   GFRNONAA >60 07/01/2018 0933   GFRAA >60 07/01/2018 0933       Component Value Date/Time   WBC 10.0 07/01/2018 0933   RBC 4.63 07/01/2018 0933   HGB 13.4 07/01/2018 0933   HCT 39.5 07/01/2018 0933   PLT 261 07/01/2018 0933   MCV 85.3 07/01/2018 0933   MCH 28.9 07/01/2018 0933   MCHC 33.9 07/01/2018 0933   RDW 13.6 07/01/2018 0933    No results found for: POCLITH, LITHIUM   No results found for: PHENYTOIN, PHENOBARB, VALPROATE, CBMZ   .res Assessment: Plan:   Decrease Lamictal to 150 mg daily since this may potentially be causing some memory and cognitive issues. Will add Adderall XR 10 mg daily for patient to take in the early afternoon when Vyvanse no longer seems to be as effective. Continue BuSpar 15 mg twice daily for anxiety. Continue citalopram 10 mg to 20 mg daily for premenstrual dysphoric disorder. Continue Trileptal 900 mg at bedtime for insomnia. Patient to follow-up with this provider in 4 weeks or sooner if clinically indicated. Patient advised to contact office with any questions, adverse effects, or acute worsening in signs and symptoms.  Attention deficit hyperactivity disorder (ADHD), predominantly inattentive type - Improved - Plan: amphetamine-dextroamphetamine (ADDERALL XR) 10 MG 24 hr capsule, lisdexamfetamine (VYVANSE) 40 MG capsule  Bipolar II disorder (HCC) - stable - Plan: lamoTRIgine (LAMICTAL) 150 MG tablet, citalopram (CELEXA) 10 MG tablet, oxcarbazepine (TRILEPTAL) 600 MG tablet  Generalized anxiety disorder - stable - Plan: busPIRone (BUSPAR) 15 MG tablet, citalopram (CELEXA) 10 MG tablet  Premenstrual dysphoric disorder - Improved - Plan: citalopram (CELEXA) 10 MG tablet  Binge eating disorder - Chronic, improved - Plan: lisdexamfetamine (VYVANSE) 40 MG capsule  Please see After Visit Summary for patient specific instructions.  No future appointments.  No orders of the defined types were placed in this encounter.      -------------------------------

## 2019-02-22 ENCOUNTER — Encounter: Payer: Self-pay | Admitting: Psychiatry

## 2019-04-03 ENCOUNTER — Other Ambulatory Visit: Payer: Self-pay

## 2019-04-03 ENCOUNTER — Telehealth: Payer: Self-pay | Admitting: Psychiatry

## 2019-04-03 DIAGNOSIS — F900 Attention-deficit hyperactivity disorder, predominantly inattentive type: Secondary | ICD-10-CM

## 2019-04-03 DIAGNOSIS — F5081 Binge eating disorder: Secondary | ICD-10-CM

## 2019-04-03 MED ORDER — LISDEXAMFETAMINE DIMESYLATE 40 MG PO CAPS: 40.0000 mg | ORAL_CAPSULE | ORAL | 0 refills | Status: DC

## 2019-04-03 NOTE — Telephone Encounter (Signed)
Pt left v-mail requesting refill on Vyvanse

## 2019-04-03 NOTE — Telephone Encounter (Signed)
Pended for approval.

## 2019-04-24 ENCOUNTER — Other Ambulatory Visit: Payer: Self-pay | Admitting: Psychiatry

## 2019-04-24 DIAGNOSIS — F3181 Bipolar II disorder: Secondary | ICD-10-CM

## 2019-05-06 ENCOUNTER — Telehealth: Payer: Self-pay | Admitting: Psychiatry

## 2019-05-06 ENCOUNTER — Other Ambulatory Visit: Payer: Self-pay

## 2019-05-06 DIAGNOSIS — F900 Attention-deficit hyperactivity disorder, predominantly inattentive type: Secondary | ICD-10-CM

## 2019-05-06 DIAGNOSIS — F5081 Binge eating disorder: Secondary | ICD-10-CM

## 2019-05-06 MED ORDER — LISDEXAMFETAMINE DIMESYLATE 40 MG PO CAPS: 40.0000 mg | ORAL_CAPSULE | ORAL | 0 refills | Status: DC

## 2019-05-06 NOTE — Telephone Encounter (Signed)
Pended for approval. Last fill 04/03/2019

## 2019-05-06 NOTE — Telephone Encounter (Signed)
Pt needs refill for Vyvanse sent to [facility] on [address].

## 2019-05-28 ENCOUNTER — Telehealth: Payer: Self-pay | Admitting: Psychiatry

## 2019-05-28 ENCOUNTER — Other Ambulatory Visit: Payer: Self-pay

## 2019-05-28 ENCOUNTER — Ambulatory Visit: Payer: 59 | Admitting: Psychiatry

## 2019-05-28 DIAGNOSIS — F5081 Binge eating disorder: Secondary | ICD-10-CM

## 2019-05-28 DIAGNOSIS — F900 Attention-deficit hyperactivity disorder, predominantly inattentive type: Secondary | ICD-10-CM

## 2019-05-28 MED ORDER — LISDEXAMFETAMINE DIMESYLATE 40 MG PO CAPS: 40.0000 mg | ORAL_CAPSULE | ORAL | 0 refills | Status: DC

## 2019-05-28 NOTE — Telephone Encounter (Signed)
Last fill 05/06/2019

## 2019-05-28 NOTE — Telephone Encounter (Signed)
[name] called because she missed her appt. This morning.  Very sorry.  Been working 3rd shift and slept through appt.  Rs for 9/4.  She needs refill of her Vyvanse.  Please send to [facility] on [address] [place].

## 2019-06-13 ENCOUNTER — Other Ambulatory Visit: Payer: Self-pay

## 2019-06-13 ENCOUNTER — Encounter: Payer: Self-pay | Admitting: Psychiatry

## 2019-06-13 ENCOUNTER — Ambulatory Visit (INDEPENDENT_AMBULATORY_CARE_PROVIDER_SITE_OTHER): Payer: 59 | Admitting: Psychiatry

## 2019-06-13 DIAGNOSIS — F3181 Bipolar II disorder: Secondary | ICD-10-CM

## 2019-06-13 DIAGNOSIS — F411 Generalized anxiety disorder: Secondary | ICD-10-CM | POA: Diagnosis not present

## 2019-06-13 DIAGNOSIS — F900 Attention-deficit hyperactivity disorder, predominantly inattentive type: Secondary | ICD-10-CM

## 2019-06-13 DIAGNOSIS — F50819 Binge eating disorder, unspecified: Secondary | ICD-10-CM

## 2019-06-13 DIAGNOSIS — F3281 Premenstrual dysphoric disorder: Secondary | ICD-10-CM | POA: Diagnosis not present

## 2019-06-13 DIAGNOSIS — F5081 Binge eating disorder: Secondary | ICD-10-CM

## 2019-06-13 MED ORDER — OXCARBAZEPINE 600 MG PO TABS: 900.0000 mg | ORAL_TABLET | Freq: Every day | ORAL | 1 refills | Status: DC

## 2019-06-13 MED ORDER — BUSPIRONE HCL 15 MG PO TABS: 15.0000 mg | ORAL_TABLET | Freq: Two times a day (BID) | ORAL | 1 refills | Status: DC

## 2019-06-13 MED ORDER — LAMOTRIGINE 150 MG PO TABS: 150.0000 mg | ORAL_TABLET | Freq: Every day | ORAL | 1 refills | Status: DC

## 2019-06-13 MED ORDER — LISDEXAMFETAMINE DIMESYLATE 40 MG PO CAPS: 40.0000 mg | ORAL_CAPSULE | ORAL | 0 refills | Status: DC

## 2019-06-13 MED ORDER — CITALOPRAM HYDROBROMIDE 10 MG PO TABS: ORAL_TABLET | ORAL | 1 refills | Status: DC

## 2019-06-13 NOTE — Progress Notes (Signed)
Tanya Parker [id number] 02-08-1976 43 y.o.  Subjective:   Patient ID:  Tanya Parker is a 43 y.o. (DOB 15-Nov-1975) female.  Chief Complaint:  Chief Complaint  Patient presents with  . Follow-up    h/o anxiety, ADD, Bipolar D/O    HPI Tanya Parker presents to the office today for follow-up of anxiety, mood, and ADD. "I'm doing great." She reports that Vyvanse seems to be effective and has not been taking Adderall consistently. She reports that her mood has been good. Denies depressed or elevated moods. Denies excessive energy or goal-directed activity. Reports that she was doing more home improvement projects when working less. She reports that she had one episode of acute anxiety in the context of severe stress and was able to work through it with prn medication, support, and re-direction. She reports that anxiety has been well controlled overall. Denies social anxiety. She reports that sleep is adequate. Normally sleeping 6-7 hours a night and is wearing cPap more. She reports that she continues to have increased appetite at night. Has been intentionally trying to lose weight. Denies SI. Reports brief periods of self-injurious ideation and was able to talk herself through.   Was working only 2 days a week for a while and was doing more home projects. She reports that she has since changed jobs. Now thinking that she was unhappy in previous job and that volume was excessive. She briefly tried another job and left when this did not work out. 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 Working 4:30 pm-1:30 am or 2-11 pm. She reports that her husband also works second shift.  Reports that her daughter has gone to college and she has recently been disrespectful towards them.   Now has a dog and reports, "this dog has brightened up our lives." Niece had COVID.   Review of Systems:  Review of Systems  Cardiovascular: Negative for palpitations.  Endocrine:        Plans to change endocrinologists and has upcoming apt . Reports good glycemic control.   Musculoskeletal: Negative for gait problem.  Neurological: Negative for tremors.  Psychiatric/Behavioral:       Please refer to HPI    Medications: I have reviewed the patient's current medications.  Current Outpatient Medications  Medication Sig Dispense Refill  . busPIRone (BUSPAR) 15 MG tablet Take 1 tablet (15 mg total) by mouth 2 (two) times daily. 180 tablet 1  . canagliflozin (INVOKANA) 100 MG TABS tablet Take 100 mg by mouth daily before breakfast.    . cholecalciferol (VITAMIN D) 1000 units tablet Take 1,000 Units by mouth daily.    . citalopram (CELEXA) 10 MG tablet Take 10 mg by mouth daily and then increase to 20 mg daily for one week before menstrual period starts and then go back to 10 mg daily 135 tablet 1  . hydrOXYzine (ATARAX/VISTARIL) 50 MG tablet Take 50 mg by mouth 3 (three) times daily as needed.    . lamoTRIgine (LAMICTAL) 150 MG tablet Take 1 tablet (150 mg total) by mouth daily. 90 tablet 1  . liraglutide (VICTOZA) 18 MG/3ML SOPN Inject 18 mg into the skin every morning.     Melene Muller ON 07/04/2019] lisdexamfetamine (VYVANSE) 40 MG capsule Take 1 capsule (40 mg total) by mouth every morning. 30 capsule 0  . lisinopril-hydrochlorothiazide (PRINZIDE,ZESTORETIC) 20-12.5 MG tablet Take 1 tablet by mouth daily.    . metFORMIN (GLUCOPHAGE) 1000 MG tablet Take 1,000 mg  by mouth 2 (two) times daily with a meal.    . Probiotic Product (PROBIOTIC PO) Take by mouth.    . rosuvastatin (CRESTOR) 10 MG tablet Take 10 mg by mouth daily.     . VENTOLIN HFA 108 (90 Base) MCG/ACT inhaler Inhale 2 puffs into the lungs every 6 (six) hours as needed for wheezing or shortness of breath.     Melene Muller. [START ON 08/01/2019] lisdexamfetamine (VYVANSE) 40 MG capsule Take 1 capsule (40 mg total) by mouth every morning. 90 capsule 0  . [START ON 08/29/2019] lisdexamfetamine (VYVANSE) 40 MG capsule Take 1  capsule (40 mg total) by mouth every morning. 30 capsule 0  . oxcarbazepine (TRILEPTAL) 600 MG tablet Take 1.5 tablets (900 mg total) by mouth at bedtime. 135 tablet 1   No current facility-administered medications for this visit.     Medication Side Effects: Other: Some occ issues with memory  Allergies: No Known Allergies  Past Medical History:  Diagnosis Date  . Anal fistula   . Anxiety   . Asthma   . Bipolar disorder (HCC)   . Chronic back pain    fell in tub few yrs ago  . Diabetes mellitus without complication (HCC)    type 2  . Hemorrhoids   . Hypertension   . Obesity   . Right tennis elbow 01/03/2018  . Seasonal allergies   . Vitamin D deficiency     Family History  Problem Relation Age of Onset  . Depression Mother   . Anxiety disorder Father   . Anxiety disorder Daughter   . Sexual abuse Other   . Depression Other   . Anxiety disorder Other   . Paranoid behavior Maternal Grandmother   . Bipolar disorder Cousin   . Anxiety disorder Cousin     Social History   Socioeconomic History  . Marital status: Married    Spouse name: Nida BoatmanBrad  . Number of children: 3  . Years of education: 4  . Highest education level: 12th grade  Occupational History  . Not on file  Social Needs  . Financial resource strain: Not on file  . Food insecurity    Worry: Not on file    Inability: Not on file  . Transportation needs    Medical: Not on file    Non-medical: Not on file  Tobacco Use  . Smoking status: Former Smoker    Packs/day: 0.25    Years: 2.00    Pack years: 0.50    Types: Cigarettes  . Smokeless tobacco: Never Used  . Tobacco comment: quit sept 2018  Substance and Sexual Activity  . Alcohol use: No    Frequency: Never  . Drug use: No  . Sexual activity: Yes    Partners: Male  Lifestyle  . Physical activity    Days per week: Not on file    Minutes per session: Not on file  . Stress: Very much  Relationships  . Social Musicianconnections    Talks on phone:  Not on file    Gets together: Not on file    Attends religious service: Not on file    Active member of club or organization: Not on file    Attends meetings of clubs or organizations: Not on file    Relationship status: Not on file  . Intimate partner violence    Fear of current or ex partner: Not on file    Emotionally abused: Not on file    Physically abused: Not on  file    Forced sexual activity: Not on file  Other Topics Concern  . Not on file  Social History Narrative  . Not on file    Past Medical History, Surgical history, Social history, and Family history were reviewed and updated as appropriate.   Please see review of systems for further details on the patient's review from today.   Objective:   Physical Exam:  BP 124/79   Pulse 94   Wt 298 lb (135.2 kg)   BMI 41.56 kg/m   Physical Exam Constitutional:      General: She is not in acute distress.    Appearance: She is well-developed.  Musculoskeletal:        General: No deformity.  Neurological:     Mental Status: She is alert and oriented to person, place, and time.     Coordination: Coordination normal.  Psychiatric:        Attention and Perception: Attention and perception normal. She does not perceive auditory or visual hallucinations.        Mood and Affect: Mood normal. Mood is not anxious or depressed. Affect is not labile, blunt, angry or inappropriate.        Speech: Speech normal.        Behavior: Behavior normal.        Thought Content: Thought content normal. Thought content does not include homicidal or suicidal ideation. Thought content does not include homicidal or suicidal plan.        Cognition and Memory: Cognition and memory normal.        Judgment: Judgment normal.     Comments: Insight intact. No delusions.      Lab Review:     Component Value Date/Time   NA 140 07/01/2018 0933   K 4.2 07/01/2018 0933   CL 102 07/01/2018 0933   CO2 28 07/01/2018 0933   GLUCOSE 206 (H)  07/01/2018 0933   BUN 16 07/01/2018 0933   CREATININE 0.69 07/01/2018 0933   CALCIUM 9.0 07/01/2018 0933   GFRNONAA >60 07/01/2018 0933   GFRAA >60 07/01/2018 0933       Component Value Date/Time   WBC 10.0 07/01/2018 0933   RBC 4.63 07/01/2018 0933   HGB 13.4 07/01/2018 0933   HCT 39.5 07/01/2018 0933   PLT 261 07/01/2018 0933   MCV 85.3 07/01/2018 0933   MCH 28.9 07/01/2018 0933   MCHC 33.9 07/01/2018 0933   RDW 13.6 07/01/2018 0933    No results found for: POCLITH, LITHIUM   No results found for: PHENYTOIN, PHENOBARB, VALPROATE, CBMZ   .res Assessment: Plan:   Will discontinue Adderall since patient reports that this is no longer needed after change in job. Will continue all other medications as prescribed. Patient to follow-up with this provider in 3 months or sooner if clinically indicated. Patient advised to contact office with any questions, adverse effects, or acute worsening in signs and symptoms.  Tanya Parker was seen today for follow-up.  Diagnoses and all orders for this visit:  Bipolar II disorder ([place]) Comments: stable Orders: -     citalopram (CELEXA) 10 MG tablet; Take 10 mg by mouth daily and then increase to 20 mg daily for one week before menstrual period starts and then go back to 10 mg daily -     oxcarbazepine (TRILEPTAL) 600 MG tablet; Take 1.5 tablets (900 mg total) by mouth at bedtime. -     lamoTRIgine (LAMICTAL) 150 MG tablet; Take 1 tablet (150 mg total) by  mouth daily.  Generalized anxiety disorder Comments: stable Orders: -     citalopram (CELEXA) 10 MG tablet; Take 10 mg by mouth daily and then increase to 20 mg daily for one week before menstrual period starts and then go back to 10 mg daily -     busPIRone (BUSPAR) 15 MG tablet; Take 1 tablet (15 mg total) by mouth 2 (two) times daily.  Premenstrual dysphoric disorder Comments: Improved Orders: -     citalopram (CELEXA) 10 MG tablet; Take 10 mg by mouth daily and then increase to 20  mg daily for one week before menstrual period starts and then go back to 10 mg daily  Binge eating disorder Comments: Chronic, improved Orders: -     lisdexamfetamine (VYVANSE) 40 MG capsule; Take 1 capsule (40 mg total) by mouth every morning.  Attention deficit hyperactivity disorder (ADHD), predominantly inattentive type Comments: Improved Orders: -     lisdexamfetamine (VYVANSE) 40 MG capsule; Take 1 capsule (40 mg total) by mouth every morning. -     lisdexamfetamine (VYVANSE) 40 MG capsule; Take 1 capsule (40 mg total) by mouth every morning. -     lisdexamfetamine (VYVANSE) 40 MG capsule; Take 1 capsule (40 mg total) by mouth every morning.     Please see After Visit Summary for patient specific instructions.  Future Appointments  Date Time Provider Department Center  09/12/2019 10:00 AM Corie Chiquitoarter, Rakiya Krawczyk, PMHNP [facility] None    No orders of the defined types were placed in this encounter.   -------------------------------

## 2019-08-11 ENCOUNTER — Other Ambulatory Visit: Payer: Self-pay | Admitting: Psychiatry

## 2019-08-11 DIAGNOSIS — F900 Attention-deficit hyperactivity disorder, predominantly inattentive type: Secondary | ICD-10-CM

## 2019-08-11 MED ORDER — LISDEXAMFETAMINE DIMESYLATE 40 MG PO CAPS: 40.0000 mg | ORAL_CAPSULE | ORAL | 0 refills | Status: DC

## 2019-08-11 NOTE — Telephone Encounter (Signed)
For Vyvanse RX dated 10/23- Needs to be switched to #30 to be filled ( was #90- not sure why), but insur won't do 90 day fill. Pharmacy also says insur is requiring a diagnosis code for filling the RX. [facility] on [address]

## 2019-08-27 ENCOUNTER — Other Ambulatory Visit: Payer: Self-pay | Admitting: Psychiatry

## 2019-08-27 DIAGNOSIS — F5101 Primary insomnia: Secondary | ICD-10-CM

## 2019-08-27 DIAGNOSIS — F411 Generalized anxiety disorder: Secondary | ICD-10-CM

## 2019-09-01 ENCOUNTER — Other Ambulatory Visit: Payer: Self-pay | Admitting: Psychiatry

## 2019-09-01 DIAGNOSIS — F5101 Primary insomnia: Secondary | ICD-10-CM

## 2019-09-01 DIAGNOSIS — F411 Generalized anxiety disorder: Secondary | ICD-10-CM

## 2019-09-12 ENCOUNTER — Encounter: Payer: Self-pay | Admitting: Psychiatry

## 2019-09-12 ENCOUNTER — Other Ambulatory Visit: Payer: Self-pay

## 2019-09-12 ENCOUNTER — Ambulatory Visit (INDEPENDENT_AMBULATORY_CARE_PROVIDER_SITE_OTHER): Payer: 59 | Admitting: Psychiatry

## 2019-09-12 VITALS — Wt 290.0 lb

## 2019-09-12 DIAGNOSIS — F3181 Bipolar II disorder: Secondary | ICD-10-CM | POA: Diagnosis not present

## 2019-09-12 DIAGNOSIS — F411 Generalized anxiety disorder: Secondary | ICD-10-CM | POA: Diagnosis not present

## 2019-09-12 DIAGNOSIS — F5081 Binge eating disorder: Secondary | ICD-10-CM

## 2019-09-12 DIAGNOSIS — F5101 Primary insomnia: Secondary | ICD-10-CM

## 2019-09-12 DIAGNOSIS — F3281 Premenstrual dysphoric disorder: Secondary | ICD-10-CM

## 2019-09-12 DIAGNOSIS — F900 Attention-deficit hyperactivity disorder, predominantly inattentive type: Secondary | ICD-10-CM

## 2019-09-12 MED ORDER — CITALOPRAM HYDROBROMIDE 10 MG PO TABS: ORAL_TABLET | ORAL | 1 refills | Status: DC

## 2019-09-12 MED ORDER — LISDEXAMFETAMINE DIMESYLATE 40 MG PO CAPS: 40.0000 mg | ORAL_CAPSULE | ORAL | 0 refills | Status: DC

## 2019-09-12 MED ORDER — HYDROXYZINE PAMOATE 25 MG PO CAPS: 25.0000 mg | ORAL_CAPSULE | Freq: Three times a day (TID) | ORAL | 1 refills | Status: DC | PRN

## 2019-09-12 MED ORDER — LAMOTRIGINE 150 MG PO TABS: 150.0000 mg | ORAL_TABLET | Freq: Every day | ORAL | 1 refills | Status: DC

## 2019-09-12 MED ORDER — OXCARBAZEPINE 600 MG PO TABS: 900.0000 mg | ORAL_TABLET | Freq: Every day | ORAL | 1 refills | Status: DC

## 2019-09-12 NOTE — Progress Notes (Signed)
Tanya Parker [id number] 1976/09/28 43 y.o.  Virtual Visit via Video Note  I connected with pt @ on 09/12/19 at 10:00 AM EST by a video enabled telemedicine application and verified that I am speaking with the correct person using two identifiers.   I discussed the limitations of evaluation and management by telemedicine and the availability of in person appointments. The patient expressed understanding and agreed to proceed.  I discussed the assessment and treatment plan with the patient. The patient was provided an opportunity to ask questions and all were answered. The patient agreed with the plan and demonstrated an understanding of the instructions.   The patient was advised to call back or seek an in-person evaluation if the symptoms worsen or if the condition fails to improve as anticipated.  I provided 25 minutes of non-face-to-face time during this encounter.  The patient was located at home.  [facility]   Tanya ChiquitoJessica Darrien Parker, PMHNP   Subjective:   Patient ID:  Tanya Parker is a 43 y.o. (DOB 1976/09/28) female.  Chief Complaint:  Chief Complaint  Patient presents with  . Follow-up    h/o depression, anxiety, attention deficit, and PMDD.    HPI Tanya Parker presents for follow-up of depression, anxiety, ADD, and PMDD.  She is now working first shift and working in compliance. Enjoys job. She reports that her anxiety has been manageable. She reports that her mood has been stable. Denies depressed moods. Denies any recent manic s/s. No significant irritability. She reports that her motivation and energy have been stable and not excessive. Sleeping well and taking hydroxyzine less. No change in sleep quantity. Occasionally tossing and turning at night. She reports that she has continued to intentionally lose weight. Appetite has been stable. She reports that she continues to have concentration difficulties and feels like she is  frequently jumping between tasks. She reports severe difficulty with concentration after 2 pm and will lose her train of thought, sometimes losing train of thought mid-sentence. Denies SI.   Past medication trials:  Citalopram Trileptal Lamictal-helpful for mood Remeron-effective but caused weight gain Seroquel-excessive somnolence Rexulti-increase glucose, helpful for mood and anxiety Xanax Ambien Trazodone Concerta-helpful Adderall-increased heart rate Ritalin Hydroxyzine BuSpar-effective for anxiety   Review of Systems:  Review of Systems  Cardiovascular: Negative for palpitations.  Musculoskeletal: Negative for gait problem.  Neurological: Negative for tremors and headaches.  Psychiatric/Behavioral:       Please refer to HPI    Medications: I have reviewed the patient's current medications.  Current Outpatient Medications  Medication Sig Dispense Refill  . busPIRone (BUSPAR) 15 MG tablet Take 1 tablet (15 mg total) by mouth 2 (two) times daily. 180 tablet 1  . canagliflozin (INVOKANA) 100 MG TABS tablet Take 100 mg by mouth daily before breakfast.    . cholecalciferol (VITAMIN D) 1000 units tablet Take 1,000 Units by mouth daily.    . citalopram (CELEXA) 10 MG tablet Take 10 mg by mouth daily and then increase to 20 mg daily for one week before menstrual period starts and then go back to 10 mg daily 135 tablet 1  . hydrOXYzine (VISTARIL) 25 MG capsule Take 1 capsule (25 mg total) by mouth 3 (three) times daily as needed. 180 capsule 1  . hydrOXYzine (VISTARIL) 50 MG capsule Take 1 capsule (50 mg total) by mouth at bedtime. 90 capsule 0  . lamoTRIgine (LAMICTAL) 150 MG tablet Take 1 tablet (150 mg total) by mouth daily.  90 tablet 1  . liraglutide (VICTOZA) 18 MG/3ML SOPN Inject 18 mg into the skin every morning.     Tanya Parker ON 11/07/2019] lisdexamfetamine (VYVANSE) 40 MG capsule Take 1 capsule (40 mg total) by mouth every morning. 30 capsule 0  . [START ON 10/10/2019]  lisdexamfetamine (VYVANSE) 40 MG capsule Take 1 capsule (40 mg total) by mouth every morning. 30 capsule 0  . lisdexamfetamine (VYVANSE) 40 MG capsule Take 1 capsule (40 mg total) by mouth every morning. 30 capsule 0  . lisinopril-hydrochlorothiazide (PRINZIDE,ZESTORETIC) 20-12.5 MG tablet Take 1 tablet by mouth daily.    . metFORMIN (GLUCOPHAGE) 1000 MG tablet Take 1,000 mg by mouth 2 (two) times daily with a meal.    . oxcarbazepine (TRILEPTAL) 600 MG tablet Take 1.5 tablets (900 mg total) by mouth at bedtime. 135 tablet 1  . Probiotic Product (PROBIOTIC PO) Take by mouth.    . rosuvastatin (CRESTOR) 10 MG tablet Take 10 mg by mouth daily.     . VENTOLIN HFA 108 (90 Base) MCG/ACT inhaler Inhale 2 puffs into the lungs every 6 (six) hours as needed for wheezing or shortness of breath.      No current facility-administered medications for this visit.     Medication Side Effects: None  Allergies: No Known Allergies  Past Medical History:  Diagnosis Date  . Anal fistula   . Anxiety   . Asthma   . Bipolar disorder (HCC)   . Chronic back pain    fell in tub few yrs ago  . Diabetes mellitus without complication (HCC)    type 2  . Hemorrhoids   . Hypertension   . Obesity   . Right tennis elbow 01/03/2018  . Seasonal allergies   . Vitamin D deficiency     Family History  Problem Relation Age of Onset  . Depression Mother   . Anxiety disorder Father   . Anxiety disorder Daughter   . Sexual abuse Other   . Depression Other   . Anxiety disorder Other   . Paranoid behavior Maternal Grandmother   . Bipolar disorder Cousin   . Anxiety disorder Cousin     Social History   Socioeconomic History  . Marital status: Married    Spouse name: Nida Boatman  . Number of children: 3  . Years of education: 4  . Highest education level: 12th grade  Occupational History  . Not on file  Social Needs  . Financial resource strain: Not on file  . Food insecurity    Worry: Not on file     Inability: Not on file  . Transportation needs    Medical: Not on file    Non-medical: Not on file  Tobacco Use  . Smoking status: Former Smoker    Packs/day: 0.25    Years: 2.00    Pack years: 0.50    Types: Cigarettes  . Smokeless tobacco: Never Used  . Tobacco comment: quit sept 2018  Substance and Sexual Activity  . Alcohol use: No    Frequency: Never  . Drug use: No  . Sexual activity: Yes    Partners: Male  Lifestyle  . Physical activity    Days per week: Not on file    Minutes per session: Not on file  . Stress: Very much  Relationships  . Social Musician on phone: Not on file    Gets together: Not on file    Attends religious service: Not on file  Active member of club or organization: Not on file    Attends meetings of clubs or organizations: Not on file    Relationship status: Not on file  . Intimate partner violence    Fear of current or ex partner: Not on file    Emotionally abused: Not on file    Physically abused: Not on file    Forced sexual activity: Not on file  Other Topics Concern  . Not on file  Social History Narrative  . Not on file    Past Medical History, Surgical history, Social history, and Family history were reviewed and updated as appropriate.   Please see review of systems for further details on the patient's review from today.   Objective:   Physical Exam:  Wt 290 lb (131.5 kg)   BMI 40.45 kg/m   Physical Exam Neurological:     Mental Status: She is alert and oriented to person, place, and time.     Cranial Nerves: No dysarthria.  Psychiatric:        Attention and Perception: Attention normal.        Mood and Affect: Mood normal.        Speech: Speech normal.        Behavior: Behavior is cooperative.        Thought Content: Thought content normal. Thought content is not paranoid or delusional. Thought content does not include homicidal or suicidal ideation. Thought content does not include homicidal or suicidal  plan.        Cognition and Memory: Cognition and memory normal.        Judgment: Judgment normal.     Lab Review:     Component Value Date/Time   NA 140 07/01/2018 0933   K 4.2 07/01/2018 0933   CL 102 07/01/2018 0933   CO2 28 07/01/2018 0933   GLUCOSE 206 (H) 07/01/2018 0933   BUN 16 07/01/2018 0933   CREATININE 0.69 07/01/2018 0933   CALCIUM 9.0 07/01/2018 0933   GFRNONAA >60 07/01/2018 0933   GFRAA >60 07/01/2018 0933       Component Value Date/Time   WBC 10.0 07/01/2018 0933   RBC 4.63 07/01/2018 0933   HGB 13.4 07/01/2018 0933   HCT 39.5 07/01/2018 0933   PLT 261 07/01/2018 0933   MCV 85.3 07/01/2018 0933   MCH 28.9 07/01/2018 0933   MCHC 33.9 07/01/2018 0933   RDW 13.6 07/01/2018 0933    No results found for: POCLITH, LITHIUM   No results found for: PHENYTOIN, PHENOBARB, VALPROATE, CBMZ   .res Assessment: Plan:   Will continue current plan of care since target signs and symptoms are well controlled without any tolerability issues. Patient to follow-up in 3 months or sooner if clinically indicated. Patient advised to contact office with any questions, adverse effects, or acute worsening in signs and symptoms.   Marjory was seen today for follow-up.  Diagnoses and all orders for this visit:  Primary insomnia -     hydrOXYzine (VISTARIL) 25 MG capsule; Take 1 capsule (25 mg total) by mouth 3 (three) times daily as needed.  Bipolar II disorder ([place]) Comments: stable Orders: -     citalopram (CELEXA) 10 MG tablet; Take 10 mg by mouth daily and then increase to 20 mg daily for one week before menstrual period starts and then go back to 10 mg daily -     lamoTRIgine (LAMICTAL) 150 MG tablet; Take 1 tablet (150 mg total) by mouth daily. -  oxcarbazepine (TRILEPTAL) 600 MG tablet; Take 1.5 tablets (900 mg total) by mouth at bedtime.  Generalized anxiety disorder Comments: stable Orders: -     hydrOXYzine (VISTARIL) 25 MG capsule; Take 1 capsule (25 mg  total) by mouth 3 (three) times daily as needed. -     citalopram (CELEXA) 10 MG tablet; Take 10 mg by mouth daily and then increase to 20 mg daily for one week before menstrual period starts and then go back to 10 mg daily  Premenstrual dysphoric disorder Comments: Improved Orders: -     citalopram (CELEXA) 10 MG tablet; Take 10 mg by mouth daily and then increase to 20 mg daily for one week before menstrual period starts and then go back to 10 mg daily  Binge eating disorder Comments: Chronic, improved Orders: -     lisdexamfetamine (VYVANSE) 40 MG capsule; Take 1 capsule (40 mg total) by mouth every morning.  Attention deficit hyperactivity disorder (ADHD), predominantly inattentive type Comments: Improved Orders: -     lisdexamfetamine (VYVANSE) 40 MG capsule; Take 1 capsule (40 mg total) by mouth every morning. -     lisdexamfetamine (VYVANSE) 40 MG capsule; Take 1 capsule (40 mg total) by mouth every morning. -     lisdexamfetamine (VYVANSE) 40 MG capsule; Take 1 capsule (40 mg total) by mouth every morning.     Please see After Visit Summary for patient specific instructions.  No future appointments.  No orders of the defined types were placed in this encounter.     -------------------------------

## 2019-09-29 IMAGING — CT CT ANGIO CHEST
2 of 6 series · 18 of 46 positions shown · IV contrast (iopamidol)
Comparison: Chest radiograph July 01, 2018

CLINICAL DATA: Shortness of breath and chest pain

EXAM:
CT ANGIOGRAPHY CHEST WITH CONTRAST
TECHNIQUE: Multidetector CT imaging of the chest was performed using the
standard protocol during bolus administration of intravenous
contrast. Multiplanar CT image reconstructions and MIPs were
obtained to evaluate the vascular anatomy.
CONTRAST:  100 mL D3DPV6-EZA IOPAMIDOL (D3DPV6-EZA) INJECTION 76%

[Series 5: thins · axial · 0.72mm/px · z∈[-250,+38]mm · 15 of 316 slices shown]
[im 14/316  lung]
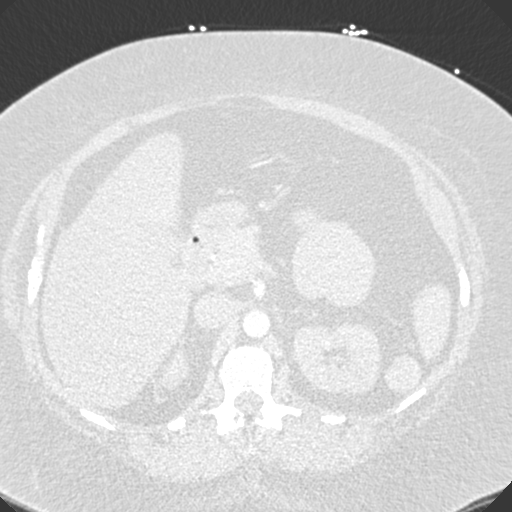
[im 42/316  soft-tissue]
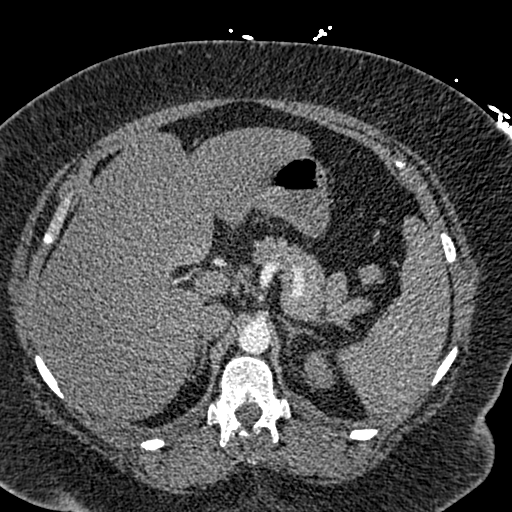
[im 55/316  lung]
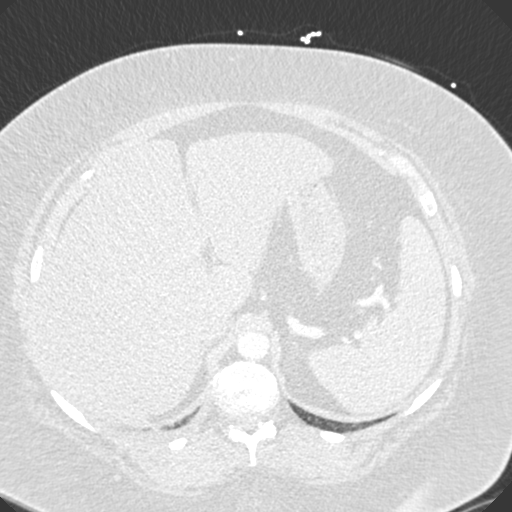
[im 83/316  soft-tissue]
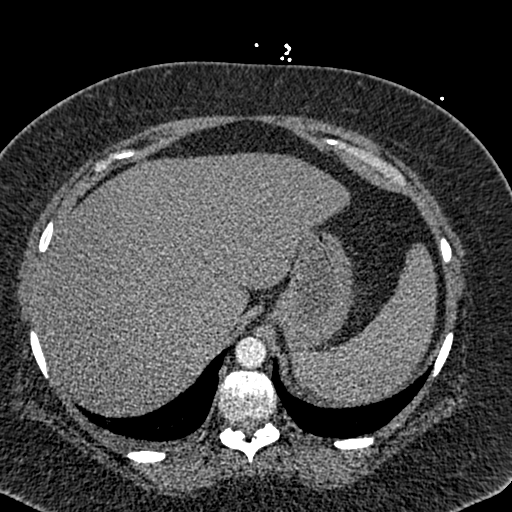
[im 96/316  lung]
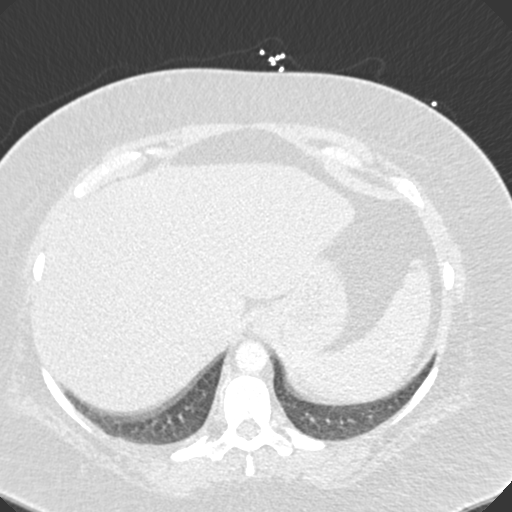
[im 124/316  soft-tissue]
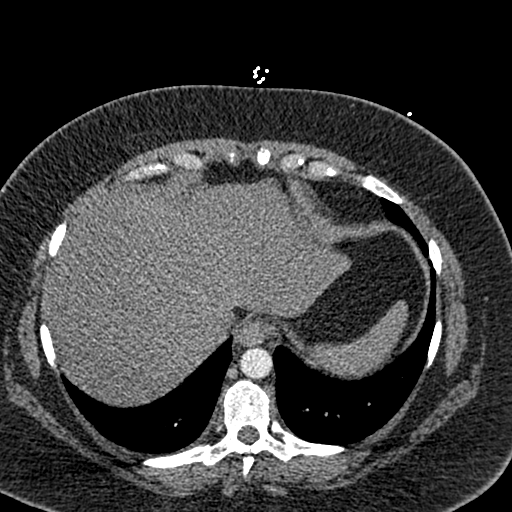
[im 137/316  lung]
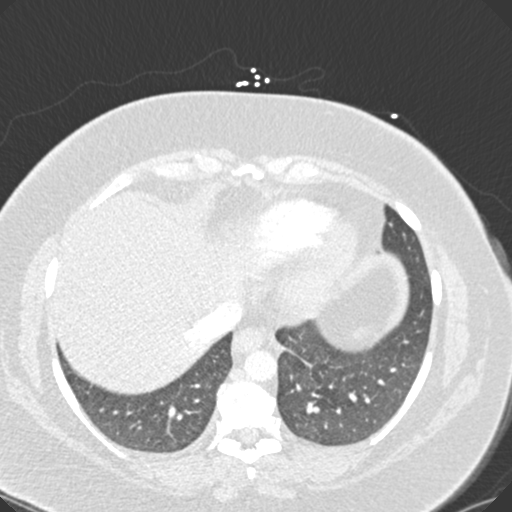
[im 165/316  soft-tissue]
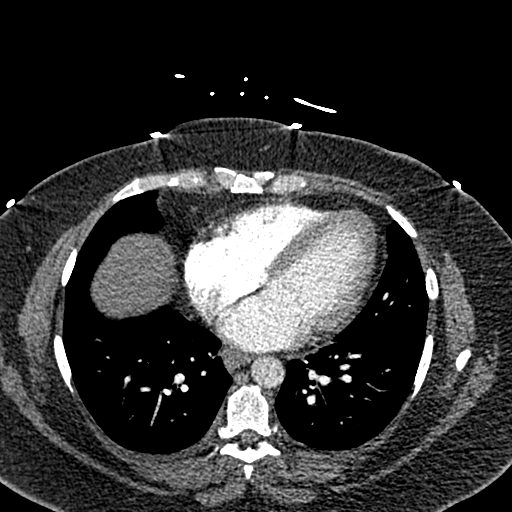
[im 179/316  lung]
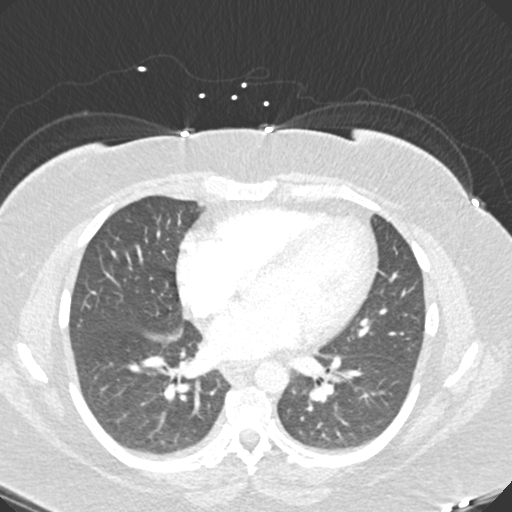
[im 192/316  soft-tissue]
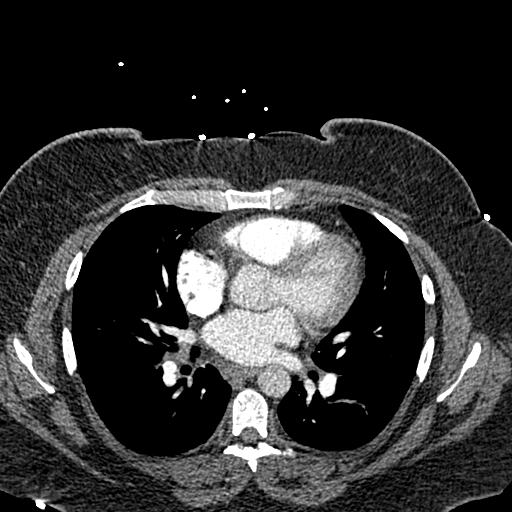
[im 220/316  lung]
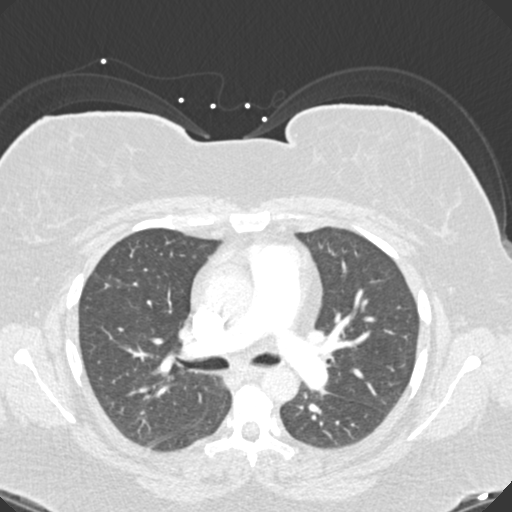
[im 233/316  soft-tissue]
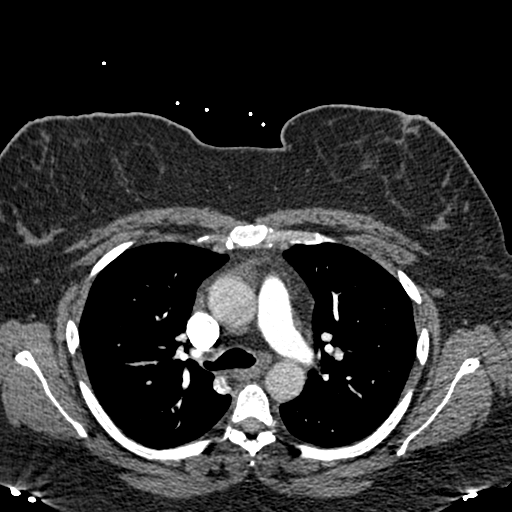
[im 261/316  lung]
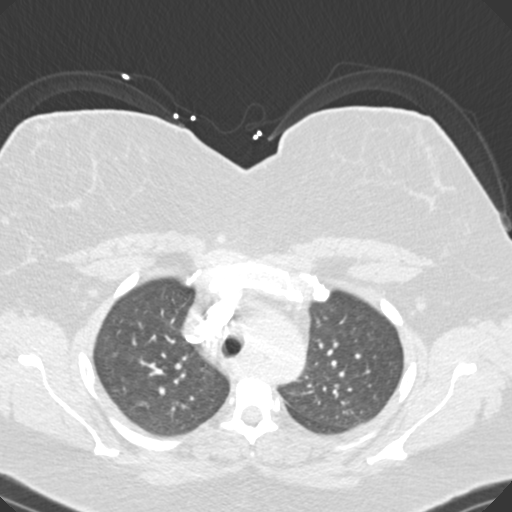
[im 274/316  soft-tissue]
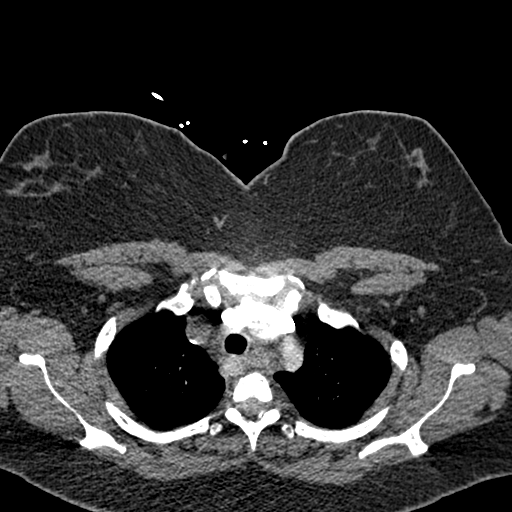
[im 302/316  lung]
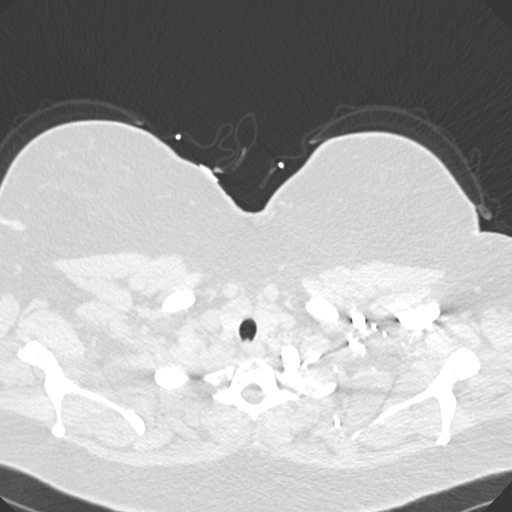

[Series 7: coronal mpr · coronal · 0.63mm/px · 3 of 156 slices shown]
[im 39/156  soft-tissue]
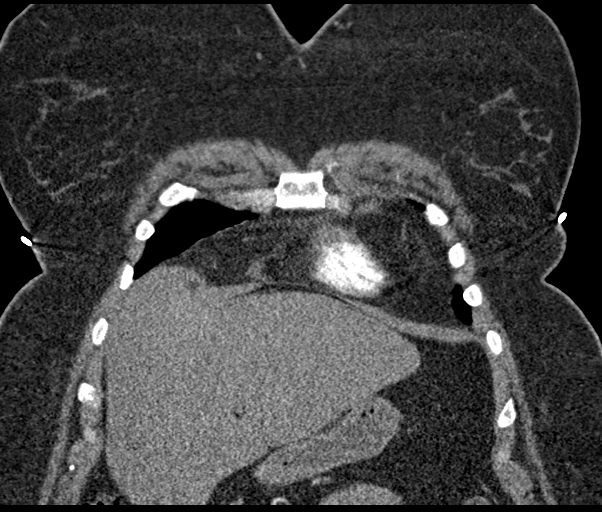
[im 78/156  soft-tissue]
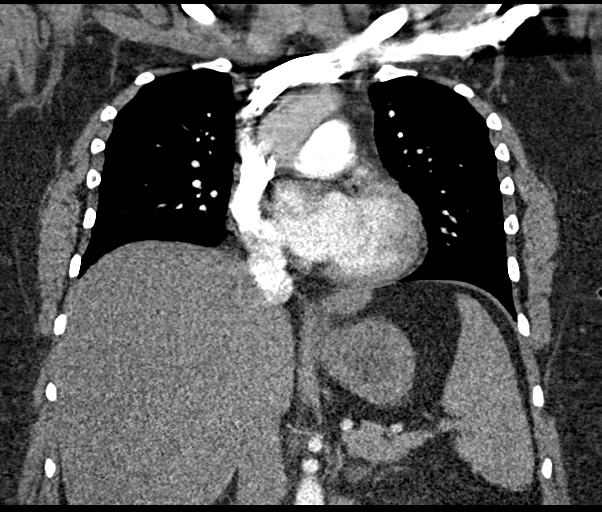
[im 117/156  soft-tissue]
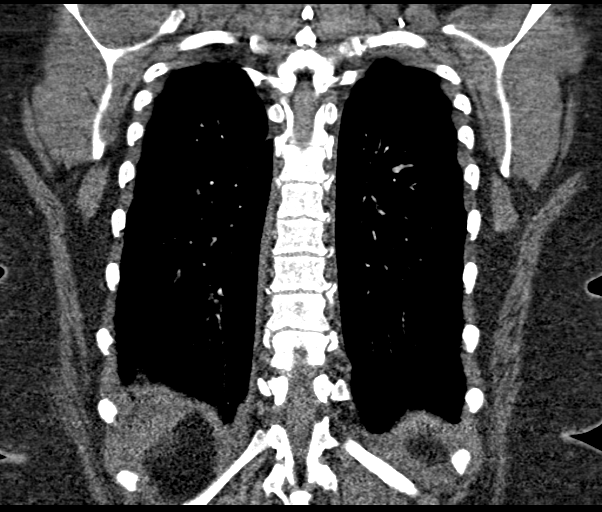

[18 of 46 positions shown; findings below may reference images not displayed]

FINDINGS: Cardiovascular: There is no demonstrable pulmonary embolus. There is
no appreciable thoracic aortic aneurysm or dissection. Visualized
great vessels appear unremarkable. The right subclavian artery
arises distal to the other subclavian arteries and passes posterior
to the trachea and esophagus to reach the right side. There is no
pericardial effusion or pericardial thickening evident.

Mediastinum/Nodes: Visualized thyroid appears normal. There is no
appreciable thoracic adenopathy. No esophageal lesions are evident.

Lungs/Pleura: There is no appreciable edema or consolidation. No
pleural effusion or pleural thickening evident.

Upper Abdomen: There is reflux of contrast into the inferior vena
cava and minimally into the hepatic veins. Spleen measures 14.0 x
13.6 x 8.9 cm with a measured splenic volume of 847 cubic cm. No
focal splenic lesions are evident. There is hepatic steatosis.

Musculoskeletal: There are no blastic or lytic bone lesions. No
chest wall lesions evident.

Review of the MIP images confirms the above findings.
IMPRESSION: 1. No demonstrable pulmonary embolus. No thoracic aortic aneurysm or
dissection. Note that there is an apparent origin of the right
subclavian artery with the right subclavian artery passing posterior
to the esophagus and trachea to reach the right side.

2.  No lung edema or consolidation.

3.  No evident thoracic aortic aneurysm.

4. Mild reflux into the inferior vena cava and hepatic veins may
indicate a degree of increase in right heart pressure.

5.  Splenic enlargement uncertain etiology.

6.  Hepatic steatosis.

## 2019-09-29 IMAGING — CR DG CHEST 2V
2 series · 2 of 2 positions shown · non-contrast
Comparison: None.

CLINICAL DATA: Chest pain and shortness of breath.

EXAM:
CHEST - 2 VIEW

[w chest pa]
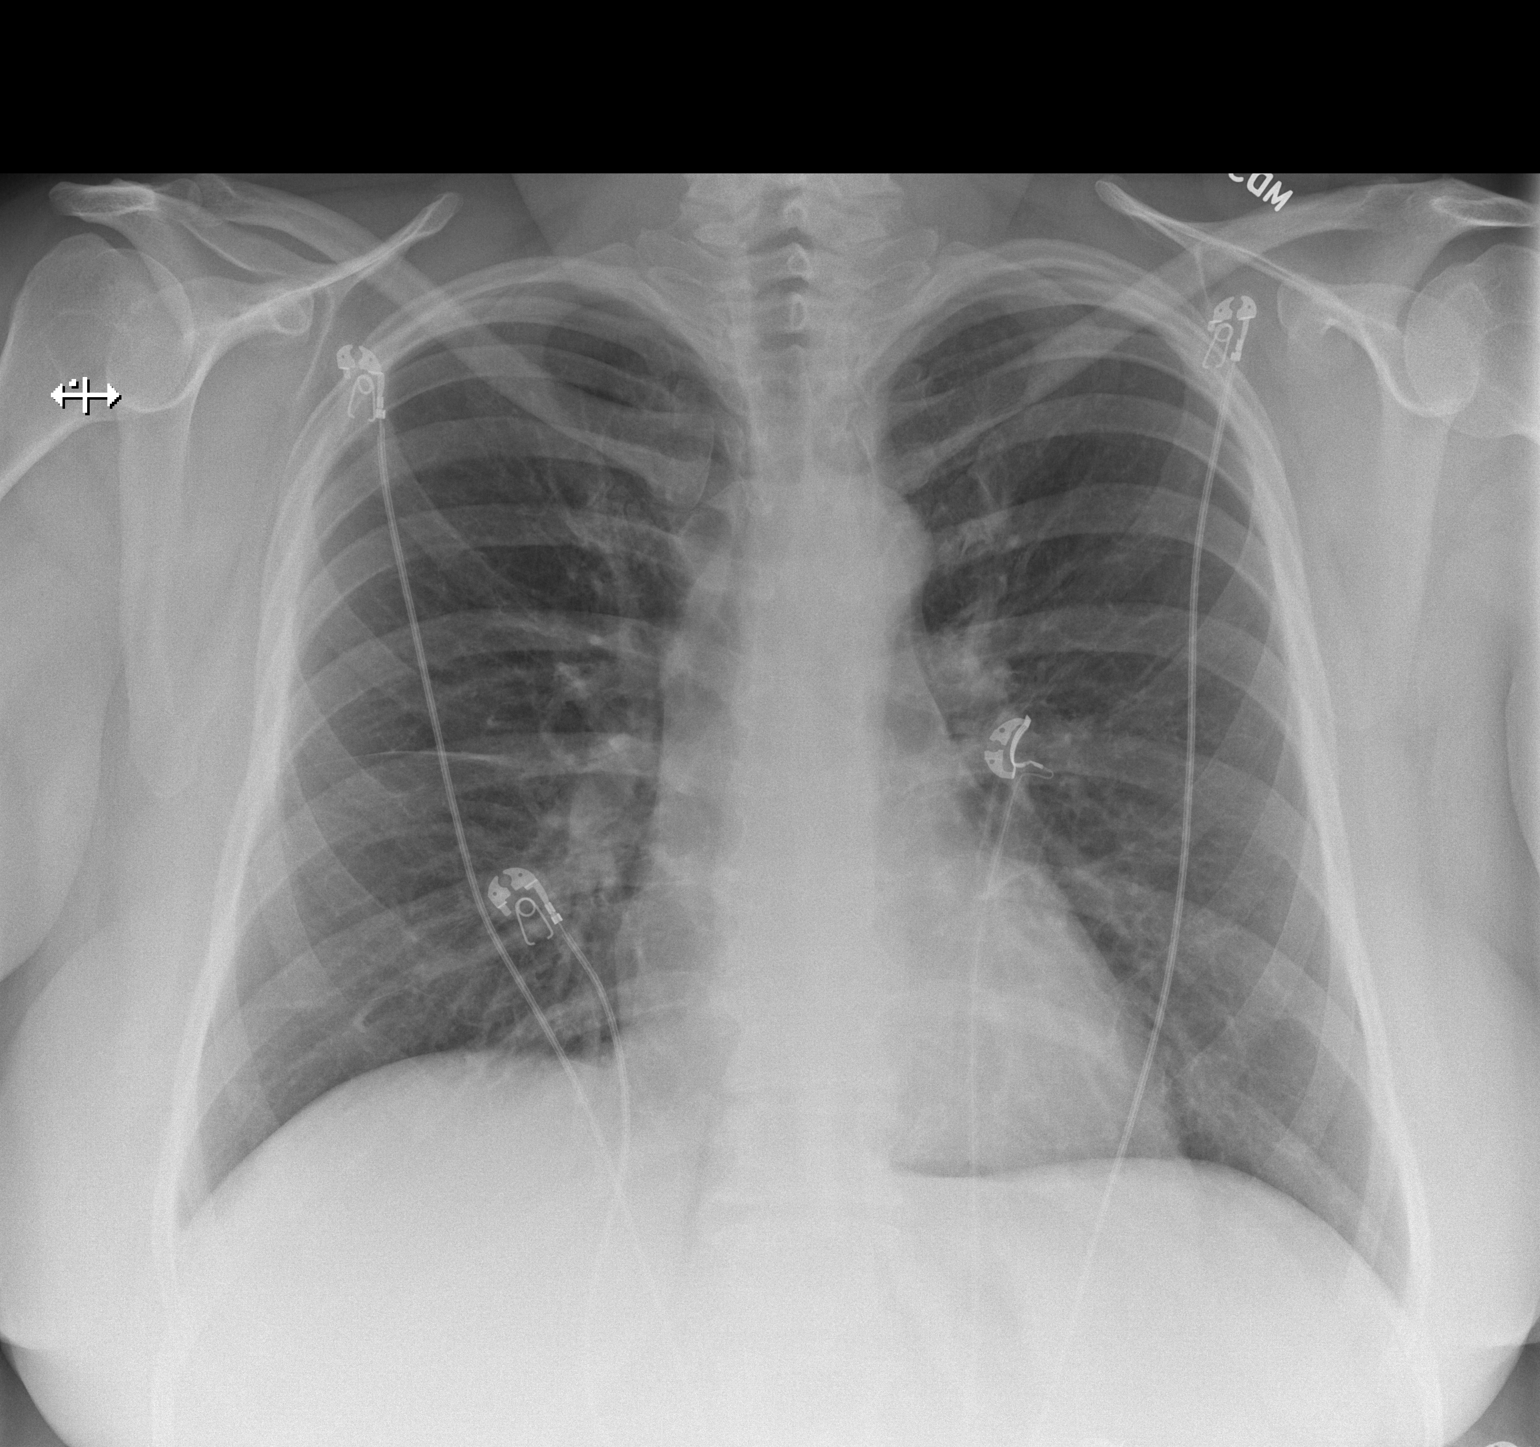

[w chest lat]
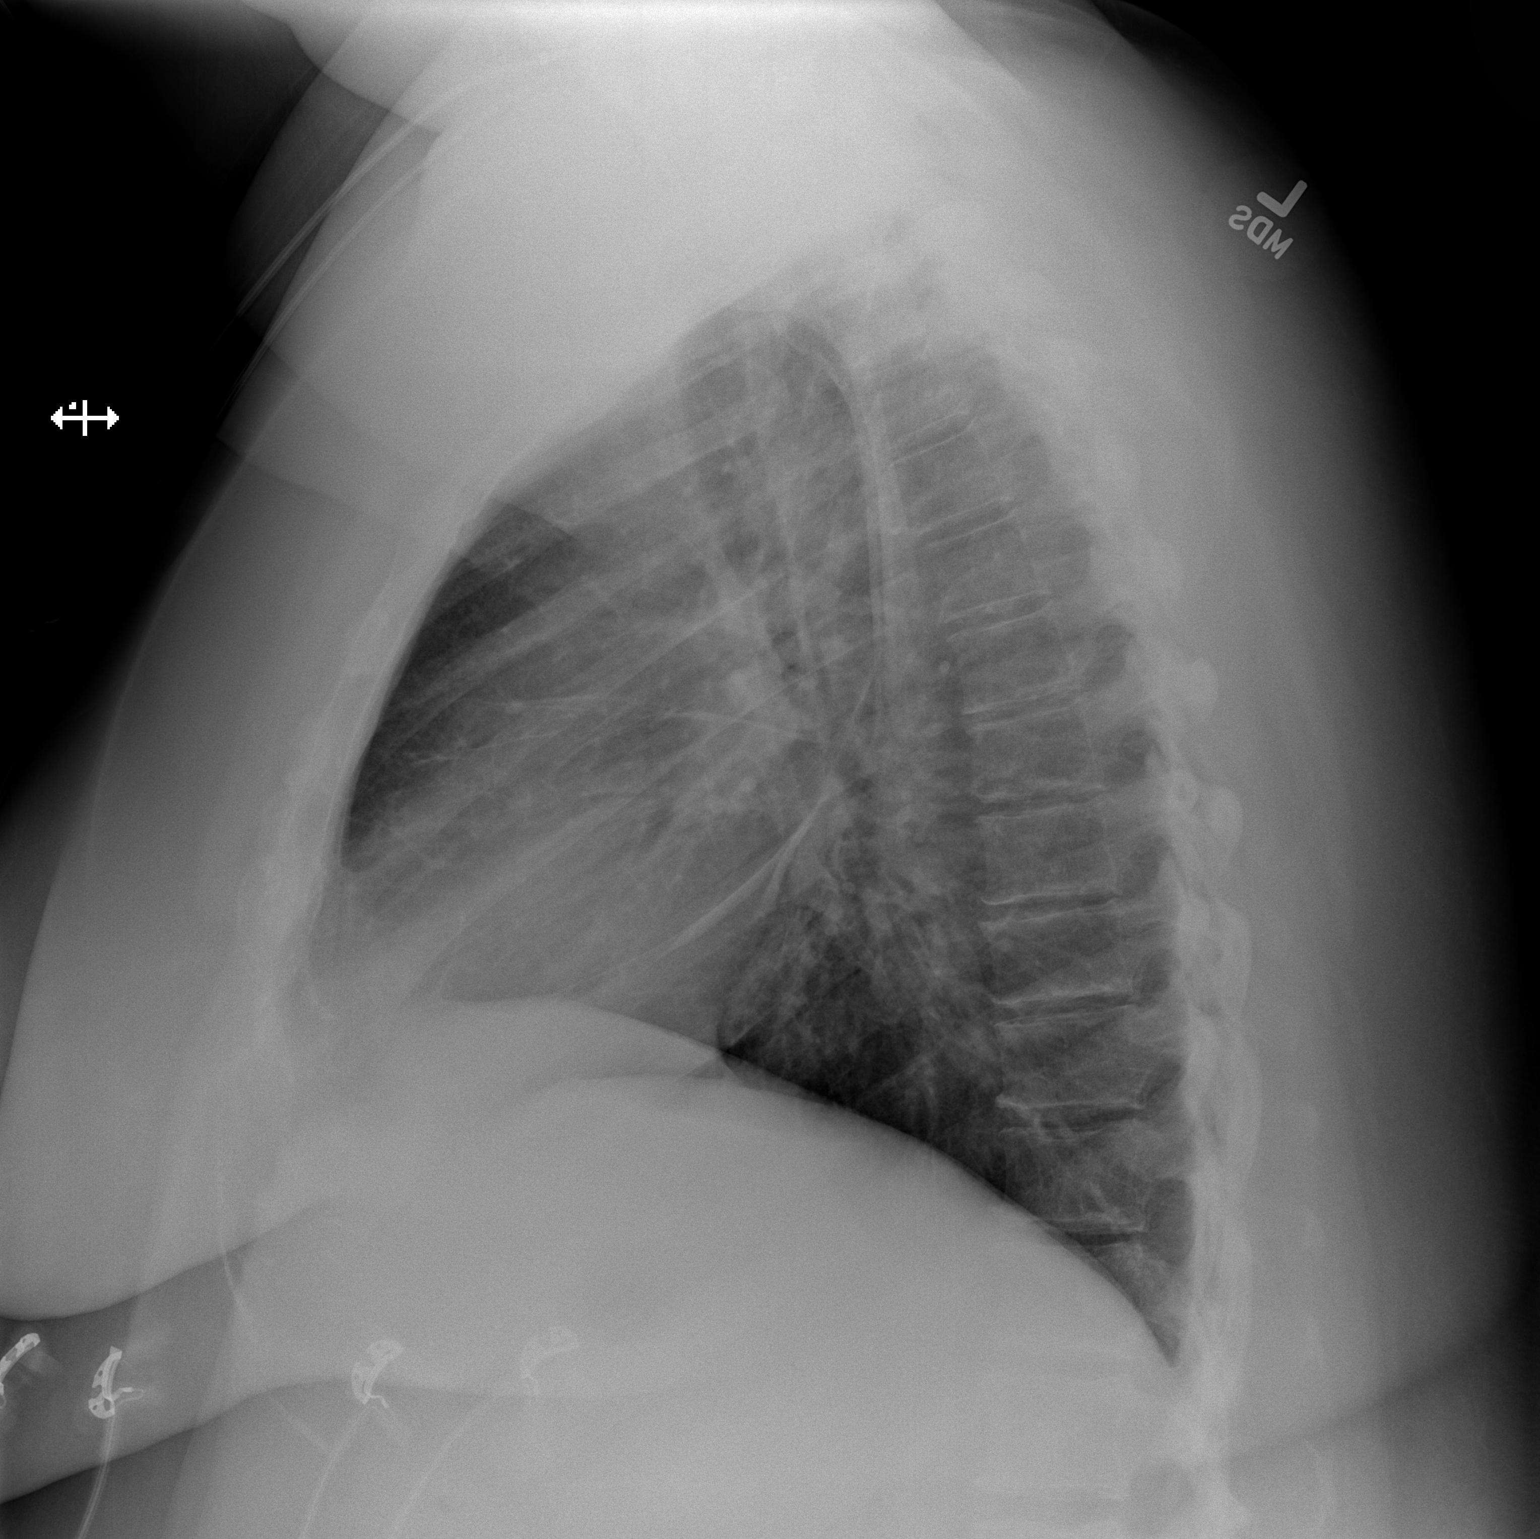

[2 of 2 positions shown; findings below may reference images not displayed]

FINDINGS: The cardiomediastinal silhouette is within normal limits. The lungs
are well inflated and clear. There is no evidence of pleural
effusion or pneumothorax. No acute osseous abnormality is
identified.
IMPRESSION: No active cardiopulmonary disease.

## 2019-11-17 DIAGNOSIS — Z03818 Encounter for observation for suspected exposure to other biological agents ruled out: Secondary | ICD-10-CM | POA: Diagnosis not present

## 2019-11-17 DIAGNOSIS — Z20828 Contact with and (suspected) exposure to other viral communicable diseases: Secondary | ICD-10-CM | POA: Diagnosis not present

## 2020-01-06 ENCOUNTER — Other Ambulatory Visit: Payer: Self-pay

## 2020-01-06 ENCOUNTER — Telehealth: Payer: Self-pay | Admitting: Psychiatry

## 2020-01-06 DIAGNOSIS — F50819 Binge eating disorder, unspecified: Secondary | ICD-10-CM

## 2020-01-06 DIAGNOSIS — F900 Attention-deficit hyperactivity disorder, predominantly inattentive type: Secondary | ICD-10-CM

## 2020-01-06 DIAGNOSIS — F5081 Binge eating disorder: Secondary | ICD-10-CM

## 2020-01-06 MED ORDER — LISDEXAMFETAMINE DIMESYLATE 40 MG PO CAPS: 40.0000 mg | ORAL_CAPSULE | ORAL | 0 refills | Status: DC

## 2020-01-06 NOTE — Telephone Encounter (Signed)
Last refill 12/10/2019, pended for Dr. Jennelle Human to submit Has apt with [facility] Bumps on 01/27/2020

## 2020-01-06 NOTE — Telephone Encounter (Signed)
Tanya Parker called to request refill of her Vyvanse.  Made appt for 01/27/20.  Send to [facility] on [address] By [place]

## 2020-01-20 DIAGNOSIS — M9903 Segmental and somatic dysfunction of lumbar region: Secondary | ICD-10-CM | POA: Diagnosis not present

## 2020-01-20 DIAGNOSIS — M545 Low back pain: Secondary | ICD-10-CM | POA: Diagnosis not present

## 2020-01-20 DIAGNOSIS — M9901 Segmental and somatic dysfunction of cervical region: Secondary | ICD-10-CM | POA: Diagnosis not present

## 2020-01-20 DIAGNOSIS — M5032 Other cervical disc degeneration, mid-cervical region, unspecified level: Secondary | ICD-10-CM | POA: Diagnosis not present

## 2020-01-21 DIAGNOSIS — M5032 Other cervical disc degeneration, mid-cervical region, unspecified level: Secondary | ICD-10-CM | POA: Diagnosis not present

## 2020-01-21 DIAGNOSIS — M545 Low back pain: Secondary | ICD-10-CM | POA: Diagnosis not present

## 2020-01-21 DIAGNOSIS — M9903 Segmental and somatic dysfunction of lumbar region: Secondary | ICD-10-CM | POA: Diagnosis not present

## 2020-01-21 DIAGNOSIS — M9901 Segmental and somatic dysfunction of cervical region: Secondary | ICD-10-CM | POA: Diagnosis not present

## 2020-01-22 DIAGNOSIS — M5032 Other cervical disc degeneration, mid-cervical region, unspecified level: Secondary | ICD-10-CM | POA: Diagnosis not present

## 2020-01-22 DIAGNOSIS — M9903 Segmental and somatic dysfunction of lumbar region: Secondary | ICD-10-CM | POA: Diagnosis not present

## 2020-01-22 DIAGNOSIS — M9901 Segmental and somatic dysfunction of cervical region: Secondary | ICD-10-CM | POA: Diagnosis not present

## 2020-01-22 DIAGNOSIS — M545 Low back pain: Secondary | ICD-10-CM | POA: Diagnosis not present

## 2020-01-27 ENCOUNTER — Ambulatory Visit: Payer: 59 | Admitting: Psychiatry

## 2020-01-27 DIAGNOSIS — M545 Low back pain: Secondary | ICD-10-CM | POA: Diagnosis not present

## 2020-01-27 DIAGNOSIS — M5032 Other cervical disc degeneration, mid-cervical region, unspecified level: Secondary | ICD-10-CM | POA: Diagnosis not present

## 2020-01-27 DIAGNOSIS — M9901 Segmental and somatic dysfunction of cervical region: Secondary | ICD-10-CM | POA: Diagnosis not present

## 2020-01-27 DIAGNOSIS — M9903 Segmental and somatic dysfunction of lumbar region: Secondary | ICD-10-CM | POA: Diagnosis not present

## 2020-01-28 DIAGNOSIS — M5032 Other cervical disc degeneration, mid-cervical region, unspecified level: Secondary | ICD-10-CM | POA: Diagnosis not present

## 2020-01-28 DIAGNOSIS — M9901 Segmental and somatic dysfunction of cervical region: Secondary | ICD-10-CM | POA: Diagnosis not present

## 2020-01-28 DIAGNOSIS — M9903 Segmental and somatic dysfunction of lumbar region: Secondary | ICD-10-CM | POA: Diagnosis not present

## 2020-01-28 DIAGNOSIS — M545 Low back pain: Secondary | ICD-10-CM | POA: Diagnosis not present

## 2020-01-30 DIAGNOSIS — M9901 Segmental and somatic dysfunction of cervical region: Secondary | ICD-10-CM | POA: Diagnosis not present

## 2020-01-30 DIAGNOSIS — M5032 Other cervical disc degeneration, mid-cervical region, unspecified level: Secondary | ICD-10-CM | POA: Diagnosis not present

## 2020-01-30 DIAGNOSIS — M9903 Segmental and somatic dysfunction of lumbar region: Secondary | ICD-10-CM | POA: Diagnosis not present

## 2020-01-30 DIAGNOSIS — M545 Low back pain: Secondary | ICD-10-CM | POA: Diagnosis not present

## 2020-02-03 DIAGNOSIS — M545 Low back pain: Secondary | ICD-10-CM | POA: Diagnosis not present

## 2020-02-03 DIAGNOSIS — M9903 Segmental and somatic dysfunction of lumbar region: Secondary | ICD-10-CM | POA: Diagnosis not present

## 2020-02-03 DIAGNOSIS — M5032 Other cervical disc degeneration, mid-cervical region, unspecified level: Secondary | ICD-10-CM | POA: Diagnosis not present

## 2020-02-03 DIAGNOSIS — M9901 Segmental and somatic dysfunction of cervical region: Secondary | ICD-10-CM | POA: Diagnosis not present

## 2020-02-04 DIAGNOSIS — I10 Essential (primary) hypertension: Secondary | ICD-10-CM | POA: Diagnosis not present

## 2020-02-04 DIAGNOSIS — E1140 Type 2 diabetes mellitus with diabetic neuropathy, unspecified: Secondary | ICD-10-CM | POA: Diagnosis not present

## 2020-02-04 DIAGNOSIS — E1159 Type 2 diabetes mellitus with other circulatory complications: Secondary | ICD-10-CM | POA: Diagnosis not present

## 2020-02-05 ENCOUNTER — Ambulatory Visit (INDEPENDENT_AMBULATORY_CARE_PROVIDER_SITE_OTHER): Payer: BC Managed Care – PPO | Admitting: Psychiatry

## 2020-02-05 ENCOUNTER — Other Ambulatory Visit: Payer: Self-pay

## 2020-02-05 DIAGNOSIS — F900 Attention-deficit hyperactivity disorder, predominantly inattentive type: Secondary | ICD-10-CM | POA: Diagnosis not present

## 2020-02-05 DIAGNOSIS — F411 Generalized anxiety disorder: Secondary | ICD-10-CM

## 2020-02-05 DIAGNOSIS — M545 Low back pain: Secondary | ICD-10-CM | POA: Diagnosis not present

## 2020-02-05 DIAGNOSIS — M9903 Segmental and somatic dysfunction of lumbar region: Secondary | ICD-10-CM | POA: Diagnosis not present

## 2020-02-05 DIAGNOSIS — F5081 Binge eating disorder: Secondary | ICD-10-CM

## 2020-02-05 DIAGNOSIS — M9901 Segmental and somatic dysfunction of cervical region: Secondary | ICD-10-CM | POA: Diagnosis not present

## 2020-02-05 DIAGNOSIS — F50819 Binge eating disorder, unspecified: Secondary | ICD-10-CM

## 2020-02-05 DIAGNOSIS — F3181 Bipolar II disorder: Secondary | ICD-10-CM

## 2020-02-05 DIAGNOSIS — F3281 Premenstrual dysphoric disorder: Secondary | ICD-10-CM

## 2020-02-05 DIAGNOSIS — M5032 Other cervical disc degeneration, mid-cervical region, unspecified level: Secondary | ICD-10-CM | POA: Diagnosis not present

## 2020-02-05 MED ORDER — CITALOPRAM HYDROBROMIDE 10 MG PO TABS: ORAL_TABLET | ORAL | 1 refills | Status: DC

## 2020-02-05 MED ORDER — BUSPIRONE HCL 15 MG PO TABS: 15.0000 mg | ORAL_TABLET | Freq: Two times a day (BID) | ORAL | 1 refills | Status: DC

## 2020-02-05 MED ORDER — LISDEXAMFETAMINE DIMESYLATE 40 MG PO CAPS: 40.0000 mg | ORAL_CAPSULE | ORAL | 0 refills | Status: DC

## 2020-02-05 MED ORDER — OXCARBAZEPINE 600 MG PO TABS: 900.0000 mg | ORAL_TABLET | Freq: Every day | ORAL | 1 refills | Status: DC

## 2020-02-05 MED ORDER — AMPHETAMINE-DEXTROAMPHETAMINE 10 MG PO TABS: ORAL_TABLET | ORAL | 0 refills | Status: DC

## 2020-02-05 MED ORDER — LAMOTRIGINE 150 MG PO TABS: 150.0000 mg | ORAL_TABLET | Freq: Every day | ORAL | 1 refills | Status: DC

## 2020-02-05 NOTE — Progress Notes (Signed)
Tanya Parker [id number] 08-01-1976 44 y.o.  Subjective:   Patient ID:  Tanya Parker is a 44 y.o. (DOB [birth year]) female.  Chief Complaint:  Chief Complaint  Patient presents with  . ADD  . Follow-up    Anxiety, mood disturbance, and insomnia    HPI Tanya Parker presents to the office today for follow-up of mood, anxiety, and insomnia. She has been working extra hours with working later days. Enjoying her job. She is doing more work when she gets home and having to do more paperwork. She feels that Vyvanse is not effective long enough and wears off around 2-3 pm before her responsibilities end at 8 pm. She reports that she has some difficulty with memory and sometimes cannot recall conversations that she had earlier that day. 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 Mood has been stable. She reports that she ran out of Trileptal and had severe irritability, mood changes, fatigue, crying, and self-injurious ideation. Mood improved about 2-3 days after re-starting Trileptal. Otherwise denies any depression. Denies any manic s/s or impulsive behaviors. She reports that her dog has been interrupting her sleep. Reports getting an adequate amount of sleep. Energy and motivation is "up and down." Denies SI.   She reports intentional weight loss and has lost a total of 84 lbs. She reports that she is active at work.   She reports that Citalopram has been helpful for PMDD s/s,   Her daughter is 7 months pregnant and his this from them for awhile. She does not know where her daughter lives and communicates minimally with family. One of their friends and his girlfriend are living with them.   Reports that she has not been using cPap recently.    Past medication trials:  Citalopram Trileptal Lamictal-helpful for mood Remeron-effective but caused weight gain Seroquel-excessive somnolence Rexulti-increase glucose, helpful for mood and  anxiety Xanax Ambien Trazodone Concerta-helpful Adderall-increased heart rate Ritalin Hydroxyzine BuSpar-effective for anxiety  Review of Systems:  Review of Systems  Musculoskeletal: Negative for gait problem.  Neurological: Negative for tremors.  Psychiatric/Behavioral:       Please refer to HPI    Medications: I have reviewed the patient's current medications.  Current Outpatient Medications  Medication Sig Dispense Refill  . busPIRone (BUSPAR) 15 MG tablet Take 1 tablet (15 mg total) by mouth 2 (two) times daily. 180 tablet 1  . cholecalciferol (VITAMIN D) 1000 units tablet Take 1,000 Units by mouth daily.    . citalopram (CELEXA) 10 MG tablet Take 10 mg by mouth daily and then increase to 20 mg daily for one week before menstrual period starts and then go back to 10 mg daily 135 tablet 1  . hydrOXYzine (VISTARIL) 50 MG capsule Take 1 capsule (50 mg total) by mouth at bedtime. (Patient taking differently: Take 50 mg by mouth at bedtime as needed. ) 90 capsule 0  . lamoTRIgine (LAMICTAL) 150 MG tablet Take 1 tablet (150 mg total) by mouth daily. 90 tablet 1  . [START ON 45/24/2021] lisdexamfetamine (VYVANSE) 40 MG capsule Take 1 capsule (40 mg total) by mouth every morning. 30 capsule 0  . [START ON 45/27/2021] lisdexamfetamine (VYVANSE) 40 MG capsule Take 1 capsule (40 mg total) by mouth every morning. 30 capsule 0  . lisinopril-hydrochlorothiazide (PRINZIDE,ZESTORETIC) 20-12.5 MG tablet Take 1 tablet by mouth daily.    Marland Kitchen SYNJARDY 02-999 MG TABS Take 1 tablet by mouth 2 (two) times daily.    Marland Kitchen  amphetamine-dextroamphetamine (ADDERALL) 10 MG tablet Take 1/2-1 tab po qd 30 tablet 0  . lisdexamfetamine (VYVANSE) 40 MG capsule Take 1 capsule (40 mg total) by mouth every morning. 30 capsule 0  . oxcarbazepine (TRILEPTAL) 600 MG tablet Take 1.5 tablets (900 mg total) by mouth at bedtime. 135 tablet 1  . VENTOLIN HFA 108 (90 Base) MCG/ACT inhaler Inhale 2 puffs into the lungs every 6  (six) hours as needed for wheezing or shortness of breath.      No current facility-administered medications for this visit.    Medication Side Effects: None  Allergies: No Known Allergies  Past Medical History:  Diagnosis Date  . Anal fistula   . Anxiety   . Asthma   . Bipolar disorder (HCC)   . Chronic back pain    fell in tub few yrs ago  . Diabetes mellitus without complication (HCC)    type 2  . Hemorrhoids   . Hypertension   . Obesity   . Right tennis elbow 01/03/2018  . Seasonal allergies   . Vitamin D deficiency     Family History  Problem Relation Age of Onset  . Depression Mother   . Anxiety disorder Father   . Anxiety disorder Daughter   . Sexual abuse Other   . Depression Other   . Anxiety disorder Other   . Paranoid behavior Maternal Grandmother   . Bipolar disorder Cousin   . Anxiety disorder Cousin     Social History   Socioeconomic History  . Marital status: Married    Spouse name: Nida Boatman  . Number of children: 3  . Years of education: 4  . Highest education level: 12th grade  Occupational History  . Not on file  Tobacco Use  . Smoking status: Former Smoker    Packs/day: 0.25    Years: 2.00    Pack years: 0.50    Types: Cigarettes  . Smokeless tobacco: Never Used  . Tobacco comment: quit sept 2018  Substance and Sexual Activity  . Alcohol use: No  . Drug use: No  . Sexual activity: Yes    Partners: Male  Other Topics Concern  . Not on file  Social History Narrative  . Not on file   Social Determinants of Health   Financial Resource Strain:   . Difficulty of Paying Living Expenses:   Food Insecurity:   . Worried About Programme researcher, broadcasting/film/video in the Last Year:   . Barista in the Last Year:   Transportation Needs:   . Freight forwarder (Medical):   Marland Kitchen Lack of Transportation (Non-Medical):   Physical Activity:   . Days of Exercise per Week:   . Minutes of Exercise per Session:   Stress:   . Feeling of Stress :    Social Connections:   . Frequency of Communication with Friends and Family:   . Frequency of Social Gatherings with Friends and Family:   . Attends Religious Services:   . Active Member of Clubs or Organizations:   . Attends Banker Meetings:   Marland Kitchen Marital Status:   Intimate Partner Violence:   . Fear of Current or Ex-Partner:   . Emotionally Abused:   Marland Kitchen Physically Abused:   . Sexually Abused:     Past Medical History, Surgical history, Social history, and Family history were reviewed and updated as appropriate.   Please see review of systems for further details on the patient's review from today.   Objective:  Physical Exam:  BP (!) 141/83   Pulse 71   Physical Exam Constitutional:      General: She is not in acute distress. Musculoskeletal:        General: No deformity.  Neurological:     Mental Status: She is alert and oriented to person, place, and time.     Coordination: Coordination normal.  Psychiatric:        Attention and Perception: Attention and perception normal. She does not perceive auditory or visual hallucinations.        Mood and Affect: Mood normal. Mood is not anxious or depressed. Affect is not labile, blunt, angry or inappropriate.        Speech: Speech normal.        Behavior: Behavior normal.        Thought Content: Thought content normal. Thought content is not paranoid or delusional. Thought content does not include homicidal or suicidal ideation. Thought content does not include homicidal or suicidal plan.        Cognition and Memory: Cognition and memory normal.        Judgment: Judgment normal.     Comments: Insight intact     Lab Review:     Component Value Date/Time   NA 140 07/01/2018 0933   K 4.2 07/01/2018 0933   CL 102 07/01/2018 0933   CO2 28 07/01/2018 0933   GLUCOSE 206 (H) 07/01/2018 0933   BUN 16 07/01/2018 0933   CREATININE 0.69 07/01/2018 0933   CALCIUM 9.0 07/01/2018 0933   GFRNONAA >60 07/01/2018 0933    GFRAA >60 07/01/2018 0933       Component Value Date/Time   WBC 10.0 07/01/2018 0933   RBC 4.63 07/01/2018 0933   HGB 13.4 07/01/2018 0933   HCT 39.5 07/01/2018 0933   PLT 261 07/01/2018 0933   MCV 85.3 07/01/2018 0933   MCH 28.9 07/01/2018 0933   MCHC 33.9 07/01/2018 0933   RDW 13.6 07/01/2018 0933    No results found for: POCLITH, LITHIUM   No results found for: PHENYTOIN, PHENOBARB, VALPROATE, CBMZ   .res Assessment: Plan:   Discussed issue with limited duration of Vyvanse and possible tx options to include switching to Mydayis or adding immediate release medication. Discussed using Adderall IR instead of Adderall XR due to it having shorter duration and being less likely to interfere with sleep. Discussed potential benefits, risks, and side effects of Adderall and pt agrees to trial of Adderall. Will start Adderall 10 mg 1/2-1 tab po qd prn. Continue all other medications as prescribed. Patient advised to contact office with any questions, adverse effects, or acute worsening in signs and symptoms. Pt to f/u in 3 months or sooner if clinically indicated.  Patient advised to contact office with any questions, adverse effects, or acute worsening in signs and symptoms.  Tanya Parker was seen today for add and follow-up.  Diagnoses and all orders for this visit:  Attention deficit hyperactivity disorder (ADHD), predominantly inattentive type -     lisdexamfetamine (VYVANSE) 40 MG capsule; Take 1 capsule (40 mg total) by mouth every morning. -     lisdexamfetamine (VYVANSE) 40 MG capsule; Take 1 capsule (40 mg total) by mouth every morning. -     lisdexamfetamine (VYVANSE) 40 MG capsule; Take 1 capsule (40 mg total) by mouth every morning. -     amphetamine-dextroamphetamine (ADDERALL) 10 MG tablet; Take 1/2-1 tab po qd  Binge eating disorder -     lisdexamfetamine (VYVANSE) 40 MG  capsule; Take 1 capsule (40 mg total) by mouth every morning.  Generalized anxiety disorder -      busPIRone (BUSPAR) 15 MG tablet; Take 1 tablet (15 mg total) by mouth 2 (two) times daily. -     citalopram (CELEXA) 10 MG tablet; Take 10 mg by mouth daily and then increase to 20 mg daily for one week before menstrual period starts and then go back to 10 mg daily  Bipolar II disorder (HCC) -     citalopram (CELEXA) 10 MG tablet; Take 10 mg by mouth daily and then increase to 20 mg daily for one week before menstrual period starts and then go back to 10 mg daily -     lamoTRIgine (LAMICTAL) 150 MG tablet; Take 1 tablet (150 mg total) by mouth daily. -     oxcarbazepine (TRILEPTAL) 600 MG tablet; Take 1.5 tablets (900 mg total) by mouth at bedtime.  Premenstrual dysphoric disorder -     citalopram (CELEXA) 10 MG tablet; Take 10 mg by mouth daily and then increase to 20 mg daily for one week before menstrual period starts and then go back to 10 mg daily     Please see After Visit Summary for patient specific instructions.  No future appointments.  No orders of the defined types were placed in this encounter.   -------------------------------

## 2020-02-06 ENCOUNTER — Encounter: Payer: Self-pay | Admitting: Psychiatry

## 2020-02-12 DIAGNOSIS — M5032 Other cervical disc degeneration, mid-cervical region, unspecified level: Secondary | ICD-10-CM | POA: Diagnosis not present

## 2020-02-12 DIAGNOSIS — M9901 Segmental and somatic dysfunction of cervical region: Secondary | ICD-10-CM | POA: Diagnosis not present

## 2020-02-12 DIAGNOSIS — M9903 Segmental and somatic dysfunction of lumbar region: Secondary | ICD-10-CM | POA: Diagnosis not present

## 2020-02-12 DIAGNOSIS — M545 Low back pain: Secondary | ICD-10-CM | POA: Diagnosis not present

## 2020-02-17 DIAGNOSIS — M9901 Segmental and somatic dysfunction of cervical region: Secondary | ICD-10-CM | POA: Diagnosis not present

## 2020-02-17 DIAGNOSIS — M545 Low back pain: Secondary | ICD-10-CM | POA: Diagnosis not present

## 2020-02-17 DIAGNOSIS — M9903 Segmental and somatic dysfunction of lumbar region: Secondary | ICD-10-CM | POA: Diagnosis not present

## 2020-02-17 DIAGNOSIS — M5032 Other cervical disc degeneration, mid-cervical region, unspecified level: Secondary | ICD-10-CM | POA: Diagnosis not present

## 2020-02-18 DIAGNOSIS — M9903 Segmental and somatic dysfunction of lumbar region: Secondary | ICD-10-CM | POA: Diagnosis not present

## 2020-02-18 DIAGNOSIS — M9901 Segmental and somatic dysfunction of cervical region: Secondary | ICD-10-CM | POA: Diagnosis not present

## 2020-02-18 DIAGNOSIS — M545 Low back pain: Secondary | ICD-10-CM | POA: Diagnosis not present

## 2020-02-18 DIAGNOSIS — M5032 Other cervical disc degeneration, mid-cervical region, unspecified level: Secondary | ICD-10-CM | POA: Diagnosis not present

## 2020-02-19 DIAGNOSIS — M5032 Other cervical disc degeneration, mid-cervical region, unspecified level: Secondary | ICD-10-CM | POA: Diagnosis not present

## 2020-02-19 DIAGNOSIS — M9903 Segmental and somatic dysfunction of lumbar region: Secondary | ICD-10-CM | POA: Diagnosis not present

## 2020-02-19 DIAGNOSIS — M545 Low back pain: Secondary | ICD-10-CM | POA: Diagnosis not present

## 2020-02-19 DIAGNOSIS — M9901 Segmental and somatic dysfunction of cervical region: Secondary | ICD-10-CM | POA: Diagnosis not present

## 2020-02-20 DIAGNOSIS — J45909 Unspecified asthma, uncomplicated: Secondary | ICD-10-CM | POA: Diagnosis not present

## 2020-02-20 DIAGNOSIS — Z720 Tobacco use: Secondary | ICD-10-CM | POA: Diagnosis not present

## 2020-02-23 DIAGNOSIS — M5032 Other cervical disc degeneration, mid-cervical region, unspecified level: Secondary | ICD-10-CM | POA: Diagnosis not present

## 2020-02-23 DIAGNOSIS — M9903 Segmental and somatic dysfunction of lumbar region: Secondary | ICD-10-CM | POA: Diagnosis not present

## 2020-02-23 DIAGNOSIS — M9901 Segmental and somatic dysfunction of cervical region: Secondary | ICD-10-CM | POA: Diagnosis not present

## 2020-02-23 DIAGNOSIS — M545 Low back pain: Secondary | ICD-10-CM | POA: Diagnosis not present

## 2020-05-24 ENCOUNTER — Emergency Department: Payer: BC Managed Care – PPO

## 2020-05-24 ENCOUNTER — Emergency Department
Admission: EM | Admit: 2020-05-24 | Discharge: 2020-05-24 | Disposition: A | Discharge status: Home / Self Care | Payer: BC Managed Care – PPO | Attending: Emergency Medicine | Admitting: Emergency Medicine

## 2020-05-24 ENCOUNTER — Other Ambulatory Visit: Payer: Self-pay

## 2020-05-24 DIAGNOSIS — Z79899 Other long term (current) drug therapy: Secondary | ICD-10-CM | POA: Insufficient documentation

## 2020-05-24 DIAGNOSIS — R110 Nausea: Secondary | ICD-10-CM | POA: Diagnosis not present

## 2020-05-24 DIAGNOSIS — F909 Attention-deficit hyperactivity disorder, unspecified type: Secondary | ICD-10-CM | POA: Insufficient documentation

## 2020-05-24 DIAGNOSIS — I10 Essential (primary) hypertension: Secondary | ICD-10-CM | POA: Diagnosis not present

## 2020-05-24 DIAGNOSIS — Z87891 Personal history of nicotine dependence: Secondary | ICD-10-CM | POA: Insufficient documentation

## 2020-05-24 DIAGNOSIS — J45909 Unspecified asthma, uncomplicated: Secondary | ICD-10-CM | POA: Insufficient documentation

## 2020-05-24 DIAGNOSIS — E1165 Type 2 diabetes mellitus with hyperglycemia: Secondary | ICD-10-CM | POA: Diagnosis not present

## 2020-05-24 DIAGNOSIS — R1032 Left lower quadrant pain: Secondary | ICD-10-CM | POA: Diagnosis not present

## 2020-05-24 LAB — COMPREHENSIVE METABOLIC PANEL
ALT: 12 U/L (ref 0–44)
AST: 15 U/L (ref 15–41)
Albumin: 4 g/dL (ref 3.5–5.0)
Alkaline Phosphatase: 55 U/L (ref 38–126)
Anion gap: 13 (ref 5–15)
BUN: 15 mg/dL (ref 6–20)
CO2: 21 mmol/L — ABNORMAL LOW (ref 22–32)
Calcium: 8.8 mg/dL — ABNORMAL LOW (ref 8.9–10.3)
Chloride: 101 mmol/L (ref 98–111)
Creatinine, Ser: 0.82 mg/dL (ref 0.44–1.00)
GFR calc Af Amer: >60 mL/min (ref >60)
GFR calc non Af Amer: >60 mL/min (ref >60)
Glucose, Bld: 129 mg/dL — ABNORMAL HIGH (ref 70–99)
Potassium: 3.7 mmol/L (ref 3.5–5.1)
Sodium: 135 mmol/L (ref 135–145)
Total Bilirubin: 0.8 mg/dL (ref 0.3–1.2)
Total Protein: 7.1 g/dL (ref 6.5–8.1)

## 2020-05-24 LAB — URINALYSIS, COMPLETE (UACMP) WITH MICROSCOPIC
Bacteria, UA: NONE SEEN
Bilirubin Urine: NEGATIVE
Glucose, UA: >=500 mg/dL — AB
Ketones, ur: 5 mg/dL — AB
Leukocytes,Ua: NEGATIVE
Nitrite: NEGATIVE
Protein, ur: NEGATIVE mg/dL
Specific Gravity, Urine: 1.038 — ABNORMAL HIGH (ref 1.005–1.030)
pH: 5 (ref 5.0–8.0)

## 2020-05-24 LAB — CBC
HCT: 44.2 % (ref 36.0–46.0)
Hemoglobin: 15.1 g/dL — ABNORMAL HIGH (ref 12.0–15.0)
MCH: 29.8 pg (ref 26.0–34.0)
MCHC: 34.2 g/dL (ref 30.0–36.0)
MCV: 87.4 fL (ref 80.0–100.0)
Platelets: 263 10*3/uL (ref 150–400)
RBC: 5.06 MIL/uL (ref 3.87–5.11)
RDW: 13.1 % (ref 11.5–15.5)
WBC: 7.7 10*3/uL (ref 4.0–10.5)
nRBC: 0 % (ref 0.0–0.2)

## 2020-05-24 LAB — LIPASE, BLOOD: Lipase: 48 U/L (ref 11–51)

## 2020-05-24 LAB — POCT PREGNANCY, URINE: Preg Test, Ur: NEGATIVE

## 2020-05-24 MED ORDER — MELOXICAM 15 MG PO TABS
15.0000 mg | ORAL_TABLET | Freq: Every day | ORAL | 0 refills | Status: DC
Start: 2020-05-24 — End: 2022-02-23

## 2020-05-24 MED ORDER — METHOCARBAMOL 500 MG PO TABS: 500.0000 mg | ORAL_TABLET | Freq: Three times a day (TID) | ORAL | 0 refills | Status: DC | PRN

## 2020-05-24 MED ORDER — IOHEXOL 300 MG/ML  SOLN
100.0000 mL | Freq: Once | INTRAMUSCULAR | Status: AC | PRN
  Administered 2020-05-24: 100 mL via INTRAVENOUS
  Filled 2020-05-24: qty 100

## 2020-05-24 NOTE — ED Triage Notes (Signed)
Pt comes via POV from home with c/o LLQ pain that started Friday. Pt states some nausea as well.

## 2020-05-24 NOTE — ED Notes (Signed)
Pt has urine specimen cup. Reminded to provide urine sample as soon as possible.

## 2020-05-24 NOTE — ED Notes (Signed)
Pt sitting calmly in chair. Reports LLQ pain with heavier periods than normal. States daughter has PCOS. Stated removed tampon that was dry yesterday then today was bleeding heavily again. Reports nausea today and thinks it is due to this pain. Pt tender at LLQ. Had BM this morning. Urination normal per pt.

## 2020-05-25 NOTE — ED Provider Notes (Signed)
[facility] Emergency Department Provider Note ____________________________________________   First MD Initiated Contact with Patient 05/24/20 1054     (approximate)  I have reviewed the triage vital signs and the nursing notes.   HISTORY  Chief Complaint LLQ pain  HPI Tanya Parker is a 44 y.o. female with history of diabetes who presents to the emergency department for treatment and evaluation of abdominal pain that started Friday and acutely worsened today. She also states that her menstrual cycle has been different this month. Her pain is in the left lower quadrant. Strong family history of endometriosis.          Past Medical History:  Diagnosis Date   Anal fistula    Anxiety    Asthma    Bipolar disorder (HCC)    Chronic back pain    fell in tub few yrs ago   Diabetes mellitus without complication (HCC)    type 2   Hemorrhoids    Hypertension    Obesity    Right tennis elbow 01/03/2018   Seasonal allergies    Vitamin D deficiency     Patient Active Problem List   Diagnosis Date Noted   GAD (generalized anxiety disorder) 09/08/2018   Insomnia 09/08/2018   ADD (attention deficit disorder) 09/08/2018   Premenstrual dysphoric disorder 09/08/2018   Bipolar II disorder (HCC) 09/08/2018   Class 3 obesity with serious comorbidity and body mass index (BMI) of 45.0 to 49.9 in adult 06/25/2017   Hypercholesterolemia 06/25/2017   Hypertension, essential 06/25/2017   Type 2 diabetes mellitus with hyperglycemia, without long-term current use of insulin (HCC) 06/25/2017   Vitamin D insufficiency 06/25/2017    Past Surgical History:  Procedure Laterality Date   FISTULOTOMY  11/2017   Partial    FISTULOTOMY N/A 01/24/2018   Procedure: FISTULOTOMY;  Surgeon: Romie Levee, MD;  Location: [facility];  Service: General;  Laterality: N/A;   WISDOM TOOTH EXTRACTION      Prior to Admission medications    Medication Sig Start Date End Date Taking? Authorizing Provider  amphetamine-dextroamphetamine (ADDERALL) 10 MG tablet Take 1/2-1 tab po qd 02/05/20   Corie Chiquito, PMHNP  busPIRone (BUSPAR) 15 MG tablet Take 1 tablet (15 mg total) by mouth 2 (two) times daily. 02/05/20   Corie Chiquito, PMHNP  cholecalciferol (VITAMIN D) 1000 units tablet Take 1,000 Units by mouth daily.    [provider]  citalopram (CELEXA) 10 MG tablet Take 10 mg by mouth daily and then increase to 20 mg daily for one week before menstrual period starts and then go back to 10 mg daily 02/05/20   Corie Chiquito, PMHNP  hydrOXYzine (VISTARIL) 50 MG capsule Take 1 capsule (50 mg total) by mouth at bedtime. Patient taking differently: Take 50 mg by mouth at bedtime as needed.  09/20/18 09/11/20  Corie Chiquito, PMHNP  lamoTRIgine (LAMICTAL) 150 MG tablet Take 1 tablet (150 mg total) by mouth daily. 02/05/20   Corie Chiquito, PMHNP  lisdexamfetamine (VYVANSE) 40 MG capsule Take 1 capsule (40 mg total) by mouth every morning. 04/01/20   Corie Chiquito, PMHNP  lisdexamfetamine (VYVANSE) 40 MG capsule Take 1 capsule (40 mg total) by mouth every morning. 03/04/20   Corie Chiquito, PMHNP  lisdexamfetamine (VYVANSE) 40 MG capsule Take 1 capsule (40 mg total) by mouth every morning. 02/05/20 03/06/20  Corie Chiquito, PMHNP  lisinopril-hydrochlorothiazide (PRINZIDE,ZESTORETIC) 20-12.5 MG tablet Take 1 tablet by mouth daily.    [provider]  meloxicam (  MOBIC) 15 MG tablet Take 1 tablet (15 mg total) by mouth daily. 05/24/20   Lachlan Mckim, Rulon Eisenmengerari B, FNP  methocarbamol (ROBAXIN) 500 MG tablet Take 1 tablet (500 mg total) by mouth every 8 (eight) hours as needed for muscle spasms. 05/24/20   Kadee Philyaw, Rulon Eisenmengerari B, FNP  oxcarbazepine (TRILEPTAL) 600 MG tablet Take 1.5 tablets (900 mg total) by mouth at bedtime. 02/05/20 05/05/20  Corie Chiquitoarter, Jessica, PMHNP  SYNJARDY 02-999 MG TABS Take 1 tablet by mouth 2 (two) times daily. 01/28/20    [provider]  VENTOLIN HFA 108 (90 Base) MCG/ACT inhaler Inhale 2 puffs into the lungs every 6 (six) hours as needed for wheezing or shortness of breath.  02/19/18   [provider]    Allergies Patient has no known allergies.  Family History  Problem Relation Age of Onset   Depression Mother    Anxiety disorder Father    Anxiety disorder Daughter    Sexual abuse Other    Depression Other    Anxiety disorder Other    Paranoid behavior Maternal Grandmother    Bipolar disorder Cousin    Anxiety disorder Cousin     Social History Social History   Tobacco Use   Smoking status: Former Smoker    Packs/day: 0.25    Years: 2.00    Pack years: 0.50    Types: Cigarettes   Smokeless tobacco: Never Used   Tobacco comment: quit sept 2018  Vaping Use   Vaping Use: Never used  Substance Use Topics   Alcohol use: No   Drug use: No    Review of Systems  Constitutional: No fever/chills Eyes: No visual changes. ENT: No sore throat. Cardiovascular: Denies chest pain. Respiratory: Denies shortness of breath. Gastrointestinal: Positive for abdominal pain.  Positive for nausea, no vomiting.  No diarrhea.  No constipation. Genitourinary: Negative for dysuria. Musculoskeletal: Negative for back pain. Skin: Negative for rash. Neurological: Negative for headaches, focal weakness or numbness. ____________________________________________   PHYSICAL EXAM:  VITAL SIGNS: ED Triage Vitals  Enc Vitals Group     BP 05/24/20 0836 (!) 146/91     Pulse Rate 05/24/20 0836 85     Resp 05/24/20 0836 18     Temp 05/24/20 0836 98.3 F (36.8 C)     Temp src --      SpO2 05/24/20 0836 99 %     Weight 05/24/20 0838 285 lb 6.4 oz (129.5 kg)     Height 05/24/20 0838 5\' 11"  (1.803 m)     Head Circumference --      Peak Flow --      Pain Score 05/24/20 0838 8     Pain Loc --      Pain Edu? --      Excl. in GC? --     Constitutional: Alert and oriented. Well  appearing and in no acute distress. Eyes: Conjunctivae are normal.  Head: Atraumatic. Nose: No congestion/rhinnorhea. Mouth/Throat: Mucous membranes are moist. Neck: No stridor.   Hematological/Lymphatic/Immunilogical: No cervical lymphadenopathy. Cardiovascular: Normal rate, regular rhythm. Grossly normal heart sounds.  Good peripheral circulation. Respiratory: Normal respiratory effort.  No retractions. Lungs CTAB. Gastrointestinal: Soft and tender on the left lower quadrant just above groin. No distention. No abdominal bruits. No CVA tenderness. Genitourinary:  Musculoskeletal: No lower extremity tenderness nor edema.  No joint effusions. Neurologic:  Normal speech and language. No gross focal neurologic deficits are appreciated. No gait instability. Skin:  Skin is warm, dry and intact. No rash noted.  Psychiatric: Mood and affect are normal. Speech and behavior are normal.  ____________________________________________   LABS (all labs ordered are listed, but only abnormal results are displayed)  Labs Reviewed  COMPREHENSIVE METABOLIC PANEL - Abnormal; Notable for the following components:      Result Value   CO2 21 (*)    Glucose, Bld 129 (*)    Calcium 8.8 (*)    All other components within normal limits  CBC - Abnormal; Notable for the following components:   Hemoglobin 15.1 (*)    All other components within normal limits  URINALYSIS, COMPLETE (UACMP) WITH MICROSCOPIC - Abnormal; Notable for the following components:   Color, Urine YELLOW (*)    APPearance HAZY (*)    Specific Gravity, Urine 1.038 (*)    Glucose, UA >=500 (*)    Hgb urine dipstick LARGE (*)    Ketones, ur 5 (*)    All other components within normal limits  LIPASE, BLOOD  POC URINE PREG, ED  POCT PREGNANCY, URINE   ____________________________________________  EKG  Not indicated. ____________________________________________  RADIOLOGY  ED MD interpretation:    Pelvic ultrasound without any  acute findings or reason for patient's pain.  CT abdomen and pelvis with contrast also negative for acute findings per radiology.  I, Kem Boroughs, personally viewed and evaluated these images (plain radiographs) as part of my medical decision making, as well as reviewing the written report by the radiologist.  Official radiology report(s): CT ABDOMEN PELVIS W CONTRAST  Result Date: 05/24/2020 CLINICAL DATA:  Left lower quadrant pain x4 days. EXAM: CT ABDOMEN AND PELVIS WITH CONTRAST TECHNIQUE: Multidetector CT imaging of the abdomen and pelvis was performed using the standard protocol following bolus administration of intravenous contrast. CONTRAST:  OMNIPAQUE IOHEXOL 300 MG/ML  SOLN COMPARISON:  None. FINDINGS: Lower chest: No acute abnormality. Hepatobiliary: No focal liver abnormality is seen. No gallstones, gallbladder wall thickening, or biliary dilatation. Pancreas: Unremarkable. No pancreatic ductal dilatation or surrounding inflammatory changes. Spleen: Normal in size without focal abnormality. Adrenals/Urinary Tract: Adrenal glands are unremarkable. Kidneys are normal, without renal calculi, focal lesion, or hydronephrosis. Bladder is unremarkable. Stomach/Bowel: Stomach is within normal limits. Appendix appears normal. No evidence of bowel wall thickening, distention, or inflammatory changes. Vascular/Lymphatic: No significant vascular findings are present. No enlarged abdominal or pelvic lymph nodes. Reproductive: Uterus and bilateral adnexa are unremarkable. Other: No abdominal wall hernia or abnormality. No abdominopelvic ascites. Musculoskeletal: No acute or significant osseous findings. IMPRESSION: No CT evidence of acute intra-abdominal pathology. Electronically Signed   By: Aram Candela M.D.   On: 05/24/2020 15:20   US PELVIC COMPLETE WITH TRANSVAGINAL  Result Date: 05/24/2020 CLINICAL DATA:  Left lower quadrant pain for 4 days EXAM: TRANSABDOMINAL AND TRANSVAGINAL  ULTRASOUND OF PELVIS TECHNIQUE: Both transabdominal and transvaginal ultrasound examinations of the pelvis were performed. Transabdominal technique was performed for global imaging of the pelvis including uterus, ovaries, adnexal regions, and pelvic cul-de-sac. It was necessary to proceed with endovaginal exam following the transabdominal exam to visualize the endometrium and ovaries. Color Doppler ultrasound was utilized to evaluate blood flow to the ovaries. COMPARISON:  None. FINDINGS: Uterus Measurements: 9.5 x 5.4 x 6.6 cm = volume: 178 mL. No fibroids or other mass visualized. Endometrium Thickness: 6 mm.  No focal abnormality visualized. Right ovary Measurements: 2.5 x 1.7 x 2.0 cm = volume: 4 mL. Normal appearance/no adnexal mass. Left ovary Measurements: 2.6 x 1.8 x 1.9 cm = volume: 5 mL. Normal appearance/no adnexal mass. Color  doppler evaluation of both ovaries demonstrates internal vascularity bilaterally. Other findings No abnormal free fluid. IMPRESSION: Normal pelvic ultrasound. Electronically Signed   By: Duanne Guess D.O.   On: 05/24/2020 13:49    ____________________________________________   PROCEDURES  Procedure(s) performed (including Critical Care):  Procedures  ____________________________________________   INITIAL IMPRESSION / ASSESSMENT AND PLAN     44 year old female presenting to the emergency department for treatment and evaluation of focal left lower quadrant pain.  Lab studies including lipase are all reassuring.  She does have greater than 500 mg/dL of glucose in her urine.  On reexamination after negative studies, most likely the pain is related to musculoskeletal strain.  She is able to reproduce the pain with flexion of the left knee.  She will be treated with anti-inflammatory and muscle relaxer.  She is to follow-up with her primary care provider for symptoms that are not improving with time and medication.  She was to return to the emergency department for  symptoms change or worsen if unable to schedule an appointment.    ___________________________________________   FINAL CLINICAL IMPRESSION(S) / ED DIAGNOSES  Final diagnoses:  Left lower quadrant abdominal pain     ED Discharge Orders         Ordered    methocarbamol (ROBAXIN) 500 MG tablet  Every 8 hours PRN     Discontinue  Reprint     05/24/20 1553    meloxicam (MOBIC) 15 MG tablet  Daily     Discontinue  Reprint     05/24/20 1553           Tanya Parker was evaluated in Emergency Department on 05/25/2020 for the symptoms described in the history of present illness. She was evaluated in the context of the global COVID-19 pandemic, which necessitated consideration that the patient might be at risk for infection with the SARS-CoV-2 virus that causes COVID-19. Institutional protocols and algorithms that pertain to the evaluation of patients at risk for COVID-19 are in a state of rapid change based on information released by regulatory bodies including the CDC and federal and state organizations. These policies and algorithms were followed during the patient's care in the ED.   Note:  This document was prepared using Dragon voice recognition software and may include unintentional dictation errors.   Chinita Pester, FNP 05/25/20 1324    Jene Every, MD 05/25/20 1356

## 2020-06-11 ENCOUNTER — Telehealth: Payer: Self-pay | Admitting: Psychiatry

## 2020-06-11 NOTE — Telephone Encounter (Signed)
Pt called for refill on Vyvanse sent to [facility] on [address]. Has an appt on 9/14.

## 2020-06-15 ENCOUNTER — Other Ambulatory Visit: Payer: Self-pay

## 2020-06-15 DIAGNOSIS — F900 Attention-deficit hyperactivity disorder, predominantly inattentive type: Secondary | ICD-10-CM

## 2020-06-15 MED ORDER — LISDEXAMFETAMINE DIMESYLATE 40 MG PO CAPS: 40.0000 mg | ORAL_CAPSULE | ORAL | 0 refills | Status: DC

## 2020-06-15 NOTE — Telephone Encounter (Signed)
Last refill 05/08/20 pended for Shanda Bumps to send

## 2020-06-22 ENCOUNTER — Ambulatory Visit: Payer: BC Managed Care – PPO | Admitting: Psychiatry

## 2020-06-28 ENCOUNTER — Ambulatory Visit (INDEPENDENT_AMBULATORY_CARE_PROVIDER_SITE_OTHER): Payer: BC Managed Care – PPO | Admitting: Psychiatry

## 2020-06-28 ENCOUNTER — Other Ambulatory Visit: Payer: Self-pay

## 2020-06-28 ENCOUNTER — Encounter: Payer: Self-pay | Admitting: Psychiatry

## 2020-06-28 DIAGNOSIS — F411 Generalized anxiety disorder: Secondary | ICD-10-CM | POA: Diagnosis not present

## 2020-06-28 DIAGNOSIS — F5081 Binge eating disorder: Secondary | ICD-10-CM

## 2020-06-28 DIAGNOSIS — F900 Attention-deficit hyperactivity disorder, predominantly inattentive type: Secondary | ICD-10-CM

## 2020-06-28 DIAGNOSIS — F3281 Premenstrual dysphoric disorder: Secondary | ICD-10-CM | POA: Diagnosis not present

## 2020-06-28 DIAGNOSIS — F3181 Bipolar II disorder: Secondary | ICD-10-CM | POA: Diagnosis not present

## 2020-06-28 MED ORDER — LISDEXAMFETAMINE DIMESYLATE 40 MG PO CAPS: 40.0000 mg | ORAL_CAPSULE | ORAL | 0 refills | Status: DC

## 2020-06-28 MED ORDER — BUSPIRONE HCL 15 MG PO TABS: 15.0000 mg | ORAL_TABLET | Freq: Two times a day (BID) | ORAL | 1 refills | Status: DC

## 2020-06-28 MED ORDER — OXCARBAZEPINE 600 MG PO TABS: ORAL_TABLET | ORAL | 1 refills | Status: DC

## 2020-06-28 MED ORDER — LAMOTRIGINE 150 MG PO TABS: 150.0000 mg | ORAL_TABLET | Freq: Every day | ORAL | 1 refills | Status: DC

## 2020-06-28 MED ORDER — HYDROXYZINE PAMOATE 50 MG PO CAPS: 50.0000 mg | ORAL_CAPSULE | Freq: Every evening | ORAL | 0 refills | Status: DC | PRN

## 2020-06-28 MED ORDER — CITALOPRAM HYDROBROMIDE 10 MG PO TABS: ORAL_TABLET | ORAL | 1 refills | Status: DC

## 2020-06-28 NOTE — Progress Notes (Signed)
Tanya Parker [id number] 03-Nov-1975 44 y.o.  Subjective:   Patient ID:  Tanya Parker is a 44 y.o. (DOB 1976/07/02) female.  Chief Complaint:  Chief Complaint  Patient presents with  . Follow-up    Mood d/o, Anxiety, ADD, and insomnia    HPI Tanya Parker presents to the office today for follow-up of mood, anxiety, and insomnia. She reports that she is "in a blah" and has been for 1-2 weeks. She reports that her work has been stressful. Denies any panic attacks. She reports having periods of anxiety that are manageable. Denies any manic s/s. Denies excessive irritability. She reports that energy was lower over the weekend. Motivation is ok. Sleep has been "off and on." Has been slightly withdrawn. She reports that concentration has been consistent with baseline. Stopped taking Adderall. Reports that Vyvanse is helpful for concentration and binge eating. Denies SI.   Youngest daughter (step-daughter) is no longer speaking to them after having a baby in July. Reports that close friend was dx'd with Stage IV Cancer, then learned his girlfriend was pregnant, they got married, and they briefly lived with them. Friend has relapsed on ETOH. He reports that there was an incident involving friend and they are now estranged. Brother-in-law and his wife are getting divorced since he relapsed. He was living with them.  She reports that she was using marijuana daily and stopped 3 days ago.    Past medication trials:  Citalopram Trileptal Lamictal-helpful for mood Remeron-effective but caused weight gain Seroquel-excessive somnolence Rexulti-increase glucose, helpful for mood and anxiety Xanax Ambien Trazodone Concerta-helpful Adderall-increased heart rate Ritalin Hydroxyzine BuSpar-effective for anxiety  Review of Systems:  Review of Systems  Constitutional: Positive for diaphoresis.  Cardiovascular: Negative for palpitations.  Genitourinary:       Decreased sexual desire   Musculoskeletal: Negative for gait problem.  Neurological: Negative for tremors.  Psychiatric/Behavioral:       Please refer to HPI    Medications:   Current Outpatient Medications  Medication Sig Dispense Refill  . Ascorbic Acid (VITAMIN C) 100 MG tablet Take 200 mg by mouth daily.    . busPIRone (BUSPAR) 15 MG tablet Take 1 tablet (15 mg total) by mouth 2 (two) times daily. 180 tablet 1  . cholecalciferol (VITAMIN D) 1000 units tablet Take 1,000 Units by mouth daily.    . citalopram (CELEXA) 10 MG tablet Take 10 mg by mouth daily and then increase to 20 mg daily for one week before menstrual period starts and then go back to 10 mg daily 135 tablet 1  . hydrOXYzine (VISTARIL) 50 MG capsule Take 1 capsule (50 mg total) by mouth at bedtime as needed. 90 capsule 0  . lamoTRIgine (LAMICTAL) 150 MG tablet Take 1 tablet (150 mg total) by mouth daily. 90 tablet 1  . [START ON 09/07/2020] lisdexamfetamine (VYVANSE) 40 MG capsule Take 1 capsule (40 mg total) by mouth every morning. 30 capsule 0  . lisinopril-hydrochlorothiazide (PRINZIDE,ZESTORETIC) 20-12.5 MG tablet Take 1 tablet by mouth daily.    . methocarbamol (ROBAXIN) 500 MG tablet Take 1 tablet (500 mg total) by mouth every 8 (eight) hours as needed for muscle spasms. 30 tablet 0  . SYNJARDY 02-999 MG TABS Take 1 tablet by mouth 2 (two) times daily.    . VENTOLIN HFA 108 (90 Base) MCG/ACT inhaler Inhale 2 puffs into the lungs every 6 (six) hours as needed for wheezing or shortness of breath.     Melene Muller ON 08/10/2020] lisdexamfetamine (  VYVANSE) 40 MG capsule Take 1 capsule (40 mg total) by mouth every morning. 30 capsule 0  . [START ON 07/13/2020] lisdexamfetamine (VYVANSE) 40 MG capsule Take 1 capsule (40 mg total) by mouth every morning. 30 capsule 0  . meloxicam (MOBIC) 15 MG tablet Take 1 tablet (15 mg total) by mouth daily. (Patient not taking: Reported on 06/28/2020) 30 tablet 0  . oxcarbazepine (TRILEPTAL) 600 MG tablet Take 0.5  tablets (300 mg total) by mouth in the morning AND 1.5 tablets (900 mg total) at bedtime. 135 tablet 1   No current facility-administered medications for this visit.    Medication Side Effects: None  Allergies: No Known Allergies  Past Medical History:  Diagnosis Date  . Anal fistula   . Anxiety   . Asthma   . Bipolar disorder (HCC)   . Chronic back pain    fell in tub few yrs ago  . Diabetes mellitus without complication (HCC)    type 2  . Hemorrhoids   . Hypertension   . Obesity   . Right tennis elbow 01/03/2018  . Seasonal allergies   . Vitamin D deficiency     Family History  Problem Relation Age of Onset  . Depression Mother   . Anxiety disorder Father   . Anxiety disorder Daughter   . Sexual abuse Other   . Depression Other   . Anxiety disorder Other   . Paranoid behavior Maternal Grandmother   . Bipolar disorder Cousin   . Anxiety disorder Cousin     Social History   Socioeconomic History  . Marital status: Married    Spouse name: Nida Boatman  . Number of children: 3  . Years of education: 4  . Highest education level: 12th grade  Occupational History  . Not on file  Tobacco Use  . Smoking status: Former Smoker    Packs/day: 0.25    Years: 2.00    Pack years: 0.50    Types: Cigarettes  . Smokeless tobacco: Never Used  . Tobacco comment: quit sept 2018  Vaping Use  . Vaping Use: Never used  Substance and Sexual Activity  . Alcohol use: No  . Drug use: No  . Sexual activity: Yes    Partners: Male  Other Topics Concern  . Not on file  Social History Narrative  . Not on file   Social Determinants of Health   Financial Resource Strain:   . Difficulty of Paying Living Expenses: Not on file  Food Insecurity:   . Worried About Programme researcher, broadcasting/film/video in the Last Year: Not on file  . Ran Out of Food in the Last Year: Not on file  Transportation Needs:   . Lack of Transportation (Medical): Not on file  . Lack of Transportation (Non-Medical): Not on  file  Physical Activity:   . Days of Exercise per Week: Not on file  . Minutes of Exercise per Session: Not on file  Stress:   . Feeling of Stress : Not on file  Social Connections:   . Frequency of Communication with Friends and Family: Not on file  . Frequency of Social Gatherings with Friends and Family: Not on file  . Attends Religious Services: Not on file  . Active Member of Clubs or Organizations: Not on file  . Attends Banker Meetings: Not on file  . Marital Status: Not on file  Intimate Partner Violence:   . Fear of Current or Ex-Partner: Not on file  . Emotionally Abused:  Not on file  . Physically Abused: Not on file  . Sexually Abused: Not on file    Past Medical History, Surgical history, Social history, and Family history were reviewed and updated as appropriate.   Please see review of systems for further details on the patient's review from today.   Objective:   Physical Exam:  BP (!) 127/92   Pulse 74   Wt 280 lb (127 kg)   BMI 39.05 kg/m   Physical Exam Constitutional:      General: She is not in acute distress. Musculoskeletal:        General: No deformity.  Neurological:     Mental Status: She is alert and oriented to person, place, and time.     Coordination: Coordination normal.  Psychiatric:        Attention and Perception: Attention and perception normal. She does not perceive auditory or visual hallucinations.        Mood and Affect: Mood is depressed. Mood is not anxious. Affect is not labile, blunt, angry or inappropriate.        Speech: Speech normal.        Behavior: Behavior normal.        Thought Content: Thought content normal. Thought content is not paranoid or delusional. Thought content does not include homicidal or suicidal ideation. Thought content does not include homicidal or suicidal plan.        Cognition and Memory: Cognition and memory normal.        Judgment: Judgment normal.     Comments: Insight intact      Lab Review:     Component Value Date/Time   NA 135 05/24/2020 0836   K 3.7 05/24/2020 0836   CL 101 05/24/2020 0836   CO2 21 (L) 05/24/2020 0836   GLUCOSE 129 (H) 05/24/2020 0836   BUN 15 05/24/2020 0836   CREATININE 0.82 05/24/2020 0836   CALCIUM 8.8 (L) 05/24/2020 0836   PROT 7.1 05/24/2020 0836   ALBUMIN 4.0 05/24/2020 0836   AST 15 05/24/2020 0836   ALT 12 05/24/2020 0836   ALKPHOS 55 05/24/2020 0836   BILITOT 0.8 05/24/2020 0836   GFRNONAA >60 05/24/2020 0836   GFRAA >60 05/24/2020 0836       Component Value Date/Time   WBC 7.7 05/24/2020 0836   RBC 5.06 05/24/2020 0836   HGB 15.1 (H) 05/24/2020 0836   HCT 44.2 05/24/2020 0836   PLT 263 05/24/2020 0836   MCV 87.4 05/24/2020 0836   MCH 29.8 05/24/2020 0836   MCHC 34.2 05/24/2020 0836   RDW 13.1 05/24/2020 0836    No results found for: POCLITH, LITHIUM   No results found for: PHENYTOIN, PHENOBARB, VALPROATE, CBMZ   .res Assessment: Plan:   Will increase Trileptal to 300 mg in the morning and continue 900 mg at bedtime to improve mood signs and symptoms, specifically racing thoughts and some recent mood lability.  Discussed potential benefits, risks, and side effects of Trileptal, to include possible dizziness.  Advised patient to contact office if this occurs. Will continue all other medications without changes. Continue Vyvanse 40 mg daily for attention deficit disorder.  Will not resume Adderall. Continue lamotrigine 150 mg daily for mood signs and symptoms. Continue citalopram 10 mg daily with increase to 20 mg 1 week prior to the onset of menses. Continue hydroxyzine as needed. Continue BuSpar 15 mg twice daily for anxiety. Discussed that it may be helpful to resume psychotherapy with Sherron Monday, LC [facility].  Patient is in agreement with this plan. Patient to follow-up with this provider in 3 months or sooner if clinically indicated. Patient advised to contact office with any questions, adverse effects, or  acute worsening in signs and symptoms.  Tanya Parker was seen today for follow-up.  Diagnoses and all orders for this visit:  Generalized anxiety disorder -     busPIRone (BUSPAR) 15 MG tablet; Take 1 tablet (15 mg total) by mouth 2 (two) times daily. -     citalopram (CELEXA) 10 MG tablet; Take 10 mg by mouth daily and then increase to 20 mg daily for one week before menstrual period starts and then go back to 10 mg daily -     hydrOXYzine (VISTARIL) 50 MG capsule; Take 1 capsule (50 mg total) by mouth at bedtime as needed.  Bipolar II disorder (HCC) -     citalopram (CELEXA) 10 MG tablet; Take 10 mg by mouth daily and then increase to 20 mg daily for one week before menstrual period starts and then go back to 10 mg daily -     lamoTRIgine (LAMICTAL) 150 MG tablet; Take 1 tablet (150 mg total) by mouth daily. -     oxcarbazepine (TRILEPTAL) 600 MG tablet; Take 0.5 tablets (300 mg total) by mouth in the morning AND 1.5 tablets (900 mg total) at bedtime.  Premenstrual dysphoric disorder -     citalopram (CELEXA) 10 MG tablet; Take 10 mg by mouth daily and then increase to 20 mg daily for one week before menstrual period starts and then go back to 10 mg daily  Generalized anxiety disorder Comments: stable Orders: -     busPIRone (BUSPAR) 15 MG tablet; Take 1 tablet (15 mg total) by mouth 2 (two) times daily. -     citalopram (CELEXA) 10 MG tablet; Take 10 mg by mouth daily and then increase to 20 mg daily for one week before menstrual period starts and then go back to 10 mg daily -     hydrOXYzine (VISTARIL) 50 MG capsule; Take 1 capsule (50 mg total) by mouth at bedtime as needed.  Attention deficit hyperactivity disorder (ADHD), predominantly inattentive type -     lisdexamfetamine (VYVANSE) 40 MG capsule; Take 1 capsule (40 mg total) by mouth every morning. -     lisdexamfetamine (VYVANSE) 40 MG capsule; Take 1 capsule (40 mg total) by mouth every morning. -     lisdexamfetamine (VYVANSE)  40 MG capsule; Take 1 capsule (40 mg total) by mouth every morning.  Binge eating disorder -     lisdexamfetamine (VYVANSE) 40 MG capsule; Take 1 capsule (40 mg total) by mouth every morning.     Please see After Visit Summary for patient specific instructions.  Future Appointments  Date Time Provider Department Center  08/26/2020  8:00 AM May, [place], [facility] None  09/27/2020  8:30 AM Corie Chiquitoarter, Trinette Vera, [facility]NP CP[facility]CP None    No orders of the defined types were placed in this encounter.   -------------------------------

## 2020-08-26 ENCOUNTER — Ambulatory Visit: Payer: BC Managed Care – PPO | Admitting: Psychiatry

## 2020-09-16 DIAGNOSIS — E1140 Type 2 diabetes mellitus with diabetic neuropathy, unspecified: Secondary | ICD-10-CM | POA: Diagnosis not present

## 2020-09-16 DIAGNOSIS — E1159 Type 2 diabetes mellitus with other circulatory complications: Secondary | ICD-10-CM | POA: Diagnosis not present

## 2020-09-16 DIAGNOSIS — I152 Hypertension secondary to endocrine disorders: Secondary | ICD-10-CM | POA: Diagnosis not present

## 2020-09-27 ENCOUNTER — Ambulatory Visit: Payer: BC Managed Care – PPO | Admitting: Psychiatry

## 2020-10-15 DIAGNOSIS — J069 Acute upper respiratory infection, unspecified: Secondary | ICD-10-CM | POA: Diagnosis not present

## 2020-10-16 DIAGNOSIS — Z1152 Encounter for screening for COVID-19: Secondary | ICD-10-CM | POA: Diagnosis not present

## 2020-11-09 ENCOUNTER — Telehealth: Payer: Self-pay | Admitting: Psychiatry

## 2020-11-09 ENCOUNTER — Other Ambulatory Visit: Payer: Self-pay

## 2020-11-09 DIAGNOSIS — F5081 Binge eating disorder: Secondary | ICD-10-CM

## 2020-11-09 DIAGNOSIS — F900 Attention-deficit hyperactivity disorder, predominantly inattentive type: Secondary | ICD-10-CM

## 2020-11-09 MED ORDER — LISDEXAMFETAMINE DIMESYLATE 40 MG PO CAPS: 40.0000 mg | ORAL_CAPSULE | ORAL | 0 refills | Status: DC

## 2020-11-09 NOTE — Telephone Encounter (Signed)
Pt would like a refill on Vyvanse. Please send to [facility] on [address]. Pt has appt 3/24

## 2020-11-09 NOTE — Telephone Encounter (Signed)
Pended for Dr. Jennelle Human to review and send. Last refill 09/23/2020

## 2020-12-12 DIAGNOSIS — M25572 Pain in left ankle and joints of left foot: Secondary | ICD-10-CM | POA: Diagnosis not present

## 2020-12-16 ENCOUNTER — Telehealth: Payer: Self-pay | Admitting: Psychiatry

## 2020-12-16 DIAGNOSIS — F900 Attention-deficit hyperactivity disorder, predominantly inattentive type: Secondary | ICD-10-CM

## 2020-12-16 MED ORDER — LISDEXAMFETAMINE DIMESYLATE 40 MG PO CAPS: 40.0000 mg | ORAL_CAPSULE | ORAL | 0 refills | Status: DC

## 2020-12-16 NOTE — Telephone Encounter (Signed)
Pt would like a refill on Vyvanse 40mg . Please send to [facility] on [address].

## 2020-12-16 NOTE — Telephone Encounter (Signed)
Sent!

## 2020-12-30 ENCOUNTER — Telehealth (INDEPENDENT_AMBULATORY_CARE_PROVIDER_SITE_OTHER): Payer: BC Managed Care – PPO | Admitting: Psychiatry

## 2020-12-30 ENCOUNTER — Encounter: Payer: Self-pay | Admitting: Psychiatry

## 2020-12-30 DIAGNOSIS — F3281 Premenstrual dysphoric disorder: Secondary | ICD-10-CM

## 2020-12-30 DIAGNOSIS — F5081 Binge eating disorder: Secondary | ICD-10-CM | POA: Diagnosis not present

## 2020-12-30 DIAGNOSIS — F3181 Bipolar II disorder: Secondary | ICD-10-CM

## 2020-12-30 DIAGNOSIS — F411 Generalized anxiety disorder: Secondary | ICD-10-CM

## 2020-12-30 DIAGNOSIS — F900 Attention-deficit hyperactivity disorder, predominantly inattentive type: Secondary | ICD-10-CM | POA: Diagnosis not present

## 2020-12-30 MED ORDER — LISDEXAMFETAMINE DIMESYLATE 40 MG PO CAPS: 40.0000 mg | ORAL_CAPSULE | ORAL | 0 refills | Status: DC

## 2020-12-30 MED ORDER — LAMOTRIGINE 150 MG PO TABS: 150.0000 mg | ORAL_TABLET | Freq: Every day | ORAL | 1 refills | Status: DC

## 2020-12-30 MED ORDER — CITALOPRAM HYDROBROMIDE 10 MG PO TABS: ORAL_TABLET | ORAL | 1 refills | Status: DC

## 2020-12-30 MED ORDER — BUSPIRONE HCL 15 MG PO TABS: 15.0000 mg | ORAL_TABLET | Freq: Two times a day (BID) | ORAL | 1 refills | Status: DC

## 2020-12-30 MED ORDER — OXCARBAZEPINE 600 MG PO TABS: ORAL_TABLET | ORAL | 1 refills | Status: DC

## 2020-12-30 NOTE — Progress Notes (Signed)
Tanya Parker [id number] 17-Jun-1976 44 y.o.  Virtual Visit via Video Note  I connected with pt @ on 12/30/20 at  2:30 PM EDT by a video enabled telemedicine application and verified that I am speaking with the correct person using two identifiers.   I discussed the limitations of evaluation and management by telemedicine and the availability of in person appointments. The patient expressed understanding and agreed to proceed.  I discussed the assessment and treatment plan with the patient. The patient was provided an opportunity to ask questions and all were answered. The patient agreed with the plan and demonstrated an understanding of the instructions.   The patient was advised to call back or seek an in-person evaluation if the symptoms worsen or if the condition fails to improve as anticipated.  I provided 30 minutes of non-face-to-face time during this encounter.  The patient was located in her vehicle outside of her place of employment in [place], [place].  The provider was located at [facility].   Corie Chiquito, PMHNP   Subjective:   Patient ID:  Tanya Parker is a 44 y.o. (DOB [birth year]) female.  Chief Complaint:  Chief Complaint  Patient presents with  . Follow-up    Anxiety, mood disturbance, insomnia, and ADD    HPI Tanya Parker presents for follow-up of mood, anxiety, insomnia, and ADD. Youngest daughter (step-daughter) moved back in with her baby about 2 months ago. Concerned that daughter has depression.   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 She reports that she had intrusive memories about this for about 1.5 weeks later. She was trying to get the drugs off of the person and touched some fentanyl and noticed "tingles all over my body." She had some sleep disturbance immediately after this occurred and denies nightmares. She reports that her anxiety has been manageable. She reports that her mood has been stable.    She reports occasional nights of disrupted sleep. She reports that she averages 4-6 hours a night of sleep. She reports that appetite has been good. Energy and motivation have been ok. Denies concentration impairment. Denies SI.   She reports that she and her husband have been having marital stressors.  Has a dog that is part poodle and [place] Dane.   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 She reports that she is looking forward to change in position and shorter commute. She reports that her current position has been stressful.    Past medication trials:  Citalopram Trileptal Lamictal-helpful for mood Remeron-effective but caused weight gain Seroquel-excessive somnolence Rexulti-increase glucose, helpful for mood and anxiety Xanax Ambien Trazodone Concerta-helpful Adderall-increased heart rate Ritalin Hydroxyzine BuSpar-effective for anxiety   Review of Systems:  Review of Systems  Cardiovascular: Negative for palpitations.  Musculoskeletal: Negative for gait problem.  Neurological: Negative for tremors.  Psychiatric/Behavioral:       Please refer to HPI    Medications: I have reviewed the patient's current medications.  Current Outpatient Medications  Medication Sig Dispense Refill  . Ascorbic Acid (VITAMIN C) 100 MG tablet Take 200 mg by mouth daily.    . cholecalciferol (VITAMIN D) 1000 units tablet Take 1,000 Units by mouth daily.    Marland Kitchen lisinopril-hydrochlorothiazide (PRINZIDE,ZESTORETIC) 20-12.5 MG tablet Take 1 tablet by mouth daily.    Marland Kitchen SYNJARDY 02-999 MG TABS Take 1 tablet by mouth 2 (two) times daily.    Marland Kitchen  VENTOLIN HFA 108 (90 Base) MCG/ACT inhaler Inhale 2 puffs into the lungs every 6 (six) hours as needed for wheezing or shortness of breath.     . busPIRone (BUSPAR) 15 MG tablet Take 1 tablet (15 mg total) by mouth 2 (two) times daily. 180 tablet 1  . citalopram (CELEXA) 10  MG tablet Take 10 mg by mouth daily and then increase to 20 mg daily for one week before menstrual period starts and then go back to 10 mg daily 135 tablet 1  . hydrOXYzine (VISTARIL) 50 MG capsule Take 1 capsule (50 mg total) by mouth at bedtime as needed. 90 capsule 0  . lamoTRIgine (LAMICTAL) 150 MG tablet Take 1 tablet (150 mg total) by mouth daily. 90 tablet 1  . [START ON 03/10/2021] lisdexamfetamine (VYVANSE) 40 MG capsule Take 1 capsule (40 mg total) by mouth every morning. 30 capsule 0  . [START ON 02/10/2021] lisdexamfetamine (VYVANSE) 40 MG capsule Take 1 capsule (40 mg total) by mouth every morning. 30 capsule 0  . [START ON 01/13/2021] lisdexamfetamine (VYVANSE) 40 MG capsule Take 1 capsule (40 mg total) by mouth every morning. 30 capsule 0  . meloxicam (MOBIC) 15 MG tablet Take 1 tablet (15 mg total) by mouth daily. (Patient not taking: Reported on 06/28/2020) 30 tablet 0  . methocarbamol (ROBAXIN) 500 MG tablet Take 1 tablet (500 mg total) by mouth every 8 (eight) hours as needed for muscle spasms. (Patient not taking: Reported on 12/30/2020) 30 tablet 0  . oxcarbazepine (TRILEPTAL) 600 MG tablet Take 0.5 tablets (300 mg total) by mouth in the morning AND 1.5 tablets (900 mg total) at bedtime. 135 tablet 1   No current facility-administered medications for this visit.    Medication Side Effects: None  Allergies: No Known Allergies  Past Medical History:  Diagnosis Date  . Anal fistula   . Anxiety   . Asthma   . Bipolar disorder (HCC)   . Chronic back pain    fell in tub few yrs ago  . Diabetes mellitus without complication (HCC)    type 2  . Hemorrhoids   . Hypertension   . Obesity   . Right tennis elbow 01/03/2018  . Seasonal allergies   . Vitamin D deficiency     Family History  Problem Relation Age of Onset  . Depression Mother   . Anxiety disorder Father   . Anxiety disorder Daughter   . Sexual abuse Other   . Depression Other   . Anxiety disorder Other   .  Paranoid behavior Maternal Grandmother   . Bipolar disorder Cousin   . Anxiety disorder Cousin     Social History   Socioeconomic History  . Marital status: Married    Spouse name: Nida BoatmanBrad  . Number of children: 3  . Years of education: 4  . Highest education level: 12th grade  Occupational History  . Not on file  Tobacco Use  . Smoking status: Former Smoker    Packs/day: 0.25    Years: 2.00    Pack years: 0.50    Types: Cigarettes  . Smokeless tobacco: Never Used  . Tobacco comment: quit sept 2018  Vaping Use  . Vaping Use: Never used  Substance and Sexual Activity  . Alcohol use: No  . Drug use: No  . Sexual activity: Yes    Partners: Male  Other Topics Concern  . Not on file  Social History Narrative  . Not on file   Social Determinants of  Health   Financial Resource Strain: Not on file  Food Insecurity: Not on file  Transportation Needs: Not on file  Physical Activity: Not on file  Stress: Not on file  Social Connections: Not on file  Intimate Partner Violence: Not on file    Past Medical History, Surgical history, Social history, and Family history were reviewed and updated as appropriate.   Please see review of systems for further details on the patient's review from today.   Objective:   Physical Exam:  Wt 285 lb (129.3 kg)   BMI 39.75 kg/m   Physical Exam Neurological:     Mental Status: She is alert and oriented to person, place, and time.     Cranial Nerves: No dysarthria.  Psychiatric:        Attention and Perception: Attention and perception normal.        Mood and Affect: Mood normal.        Speech: Speech normal.        Behavior: Behavior is cooperative.        Thought Content: Thought content normal. Thought content is not paranoid or delusional. Thought content does not include homicidal or suicidal ideation. Thought content does not include homicidal or suicidal plan.        Cognition and Memory: Cognition and memory normal.         Judgment: Judgment normal.     Comments: Insight intact     Lab Review:     Component Value Date/Time   NA 135 05/24/2020 0836   K 3.7 05/24/2020 0836   CL 101 05/24/2020 0836   CO2 21 (L) 05/24/2020 0836   GLUCOSE 129 (H) 05/24/2020 0836   BUN 15 05/24/2020 0836   CREATININE 0.82 05/24/2020 0836   CALCIUM 8.8 (L) 05/24/2020 0836   PROT 7.1 05/24/2020 0836   ALBUMIN 4.0 05/24/2020 0836   AST 15 05/24/2020 0836   ALT 12 05/24/2020 0836   ALKPHOS 55 05/24/2020 0836   BILITOT 0.8 05/24/2020 0836   GFRNONAA >60 05/24/2020 0836   GFRAA >60 05/24/2020 0836       Component Value Date/Time   WBC 7.7 05/24/2020 0836   RBC 5.06 05/24/2020 0836   HGB 15.1 (H) 05/24/2020 0836   HCT 44.2 05/24/2020 0836   PLT 263 05/24/2020 0836   MCV 87.4 05/24/2020 0836   MCH 29.8 05/24/2020 0836   MCHC 34.2 05/24/2020 0836   RDW 13.1 05/24/2020 0836    No results found for: POCLITH, LITHIUM   No results found for: PHENYTOIN, PHENOBARB, VALPROATE, CBMZ   .res Assessment: Plan:   Will continue current plan of care since target signs and symptoms are well controlled without any tolerability issues. Continue BuSpar 15 mg twice daily for anxiety. Continue citalopram 10 mg daily and 20 mg daily 1 week prior to menses. Continue hydroxyzine as needed for insomnia and anxiety. Continue lamotrigine 150 mg daily for mood signs and symptoms. Continue Trileptal 300 mg in the morning and 900 mg at bedtime for mood stabilization and anxiety. Continue Vyvanse 40 mg daily for attention deficit disorder. Patient to follow-up in 3 months or sooner if clinically indicated. Patient advised to contact office with any questions, adverse effects, or acute worsening in signs and symptoms.  Tanya Parker was seen today for follow-up.  Diagnoses and all orders for this visit:  Attention deficit hyperactivity disorder (ADHD), predominantly inattentive type -     lisdexamfetamine (VYVANSE) 40 MG capsule; Take 1  capsule (40 mg total)  by mouth every morning. -     lisdexamfetamine (VYVANSE) 40 MG capsule; Take 1 capsule (40 mg total) by mouth every morning. -     lisdexamfetamine (VYVANSE) 40 MG capsule; Take 1 capsule (40 mg total) by mouth every morning.  Binge eating disorder -     lisdexamfetamine (VYVANSE) 40 MG capsule; Take 1 capsule (40 mg total) by mouth every morning.  Generalized anxiety disorder -     busPIRone (BUSPAR) 15 MG tablet; Take 1 tablet (15 mg total) by mouth 2 (two) times daily. -     citalopram (CELEXA) 10 MG tablet; Take 10 mg by mouth daily and then increase to 20 mg daily for one week before menstrual period starts and then go back to 10 mg daily  Bipolar II disorder (HCC) -     citalopram (CELEXA) 10 MG tablet; Take 10 mg by mouth daily and then increase to 20 mg daily for one week before menstrual period starts and then go back to 10 mg daily -     lamoTRIgine (LAMICTAL) 150 MG tablet; Take 1 tablet (150 mg total) by mouth daily. -     oxcarbazepine (TRILEPTAL) 600 MG tablet; Take 0.5 tablets (300 mg total) by mouth in the morning AND 1.5 tablets (900 mg total) at bedtime.  Premenstrual dysphoric disorder -     citalopram (CELEXA) 10 MG tablet; Take 10 mg by mouth daily and then increase to 20 mg daily for one week before menstrual period starts and then go back to 10 mg daily     Please see After Visit Summary for patient specific instructions.  No future appointments.  No orders of the defined types were placed in this encounter.     -------------------------------

## 2021-02-05 ENCOUNTER — Other Ambulatory Visit: Payer: Self-pay | Admitting: Psychiatry

## 2021-02-05 DIAGNOSIS — F411 Generalized anxiety disorder: Secondary | ICD-10-CM

## 2021-04-29 ENCOUNTER — Other Ambulatory Visit: Payer: Self-pay

## 2021-04-29 ENCOUNTER — Telehealth: Payer: Self-pay | Admitting: Psychiatry

## 2021-04-29 DIAGNOSIS — F900 Attention-deficit hyperactivity disorder, predominantly inattentive type: Secondary | ICD-10-CM

## 2021-04-29 DIAGNOSIS — F5081 Binge eating disorder: Secondary | ICD-10-CM

## 2021-04-29 MED ORDER — LISDEXAMFETAMINE DIMESYLATE 40 MG PO CAPS: 40.0000 mg | ORAL_CAPSULE | ORAL | 0 refills | Status: DC

## 2021-04-29 NOTE — Telephone Encounter (Signed)
Next visit is 06/06/21. Requesting refill on Vyvanse called to:  [facility] [address] [place], [place] - [address]  Phone:  [phone]  Fax:  [phone]

## 2021-04-29 NOTE — Telephone Encounter (Signed)
Pended.

## 2021-05-21 DIAGNOSIS — Z20822 Contact with and (suspected) exposure to covid-19: Secondary | ICD-10-CM | POA: Diagnosis not present

## 2021-05-21 DIAGNOSIS — J029 Acute pharyngitis, unspecified: Secondary | ICD-10-CM | POA: Diagnosis not present

## 2021-05-27 DIAGNOSIS — R509 Fever, unspecified: Secondary | ICD-10-CM | POA: Diagnosis not present

## 2021-05-27 DIAGNOSIS — Z03818 Encounter for observation for suspected exposure to other biological agents ruled out: Secondary | ICD-10-CM | POA: Diagnosis not present

## 2021-05-27 DIAGNOSIS — J039 Acute tonsillitis, unspecified: Secondary | ICD-10-CM | POA: Diagnosis not present

## 2021-05-27 DIAGNOSIS — J029 Acute pharyngitis, unspecified: Secondary | ICD-10-CM | POA: Diagnosis not present

## 2021-05-27 DIAGNOSIS — R0981 Nasal congestion: Secondary | ICD-10-CM | POA: Diagnosis not present

## 2021-05-30 DIAGNOSIS — J36 Peritonsillar abscess: Secondary | ICD-10-CM | POA: Diagnosis not present

## 2021-05-30 DIAGNOSIS — K120 Recurrent oral aphthae: Secondary | ICD-10-CM | POA: Diagnosis not present

## 2021-06-02 ENCOUNTER — Other Ambulatory Visit: Payer: Self-pay

## 2021-06-02 ENCOUNTER — Emergency Department (HOSPITAL_COMMUNITY)
Admission: EM | Admit: 2021-06-02 | Discharge: 2021-06-02 | Disposition: A | Discharge status: Home / Self Care | Payer: BC Managed Care – PPO | Attending: Emergency Medicine | Admitting: Emergency Medicine

## 2021-06-02 DIAGNOSIS — T380X5A Adverse effect of glucocorticoids and synthetic analogues, initial encounter: Secondary | ICD-10-CM | POA: Insufficient documentation

## 2021-06-02 DIAGNOSIS — Z87891 Personal history of nicotine dependence: Secondary | ICD-10-CM | POA: Insufficient documentation

## 2021-06-02 DIAGNOSIS — J029 Acute pharyngitis, unspecified: Secondary | ICD-10-CM | POA: Diagnosis not present

## 2021-06-02 DIAGNOSIS — R21 Rash and other nonspecific skin eruption: Secondary | ICD-10-CM | POA: Diagnosis not present

## 2021-06-02 DIAGNOSIS — Z79899 Other long term (current) drug therapy: Secondary | ICD-10-CM | POA: Diagnosis not present

## 2021-06-02 DIAGNOSIS — R000 Tachycardia, unspecified: Secondary | ICD-10-CM | POA: Diagnosis not present

## 2021-06-02 DIAGNOSIS — I10 Essential (primary) hypertension: Secondary | ICD-10-CM | POA: Insufficient documentation

## 2021-06-02 DIAGNOSIS — R1110 Vomiting, unspecified: Secondary | ICD-10-CM | POA: Diagnosis not present

## 2021-06-02 DIAGNOSIS — R0602 Shortness of breath: Secondary | ICD-10-CM | POA: Insufficient documentation

## 2021-06-02 DIAGNOSIS — J45909 Unspecified asthma, uncomplicated: Secondary | ICD-10-CM | POA: Diagnosis not present

## 2021-06-02 LAB — CBC WITH DIFFERENTIAL/PLATELET
Abs Immature Granulocytes: 0.03 10*3/uL (ref 0.00–0.07)
Basophils Absolute: 0 10*3/uL (ref 0.0–0.1)
Basophils Relative: 0 %
Eosinophils Absolute: 0.1 10*3/uL (ref 0.0–0.5)
Eosinophils Relative: 2 %
HCT: 46 % (ref 36.0–46.0)
Hemoglobin: 15.5 g/dL — ABNORMAL HIGH (ref 12.0–15.0)
Immature Granulocytes: 1 %
Lymphocytes Relative: 27 %
Lymphs Abs: 1.6 10*3/uL (ref 0.7–4.0)
MCH: 29.6 pg (ref 26.0–34.0)
MCHC: 33.7 g/dL (ref 30.0–36.0)
MCV: 87.8 fL (ref 80.0–100.0)
Monocytes Absolute: 0.4 10*3/uL (ref 0.1–1.0)
Monocytes Relative: 7 %
Neutro Abs: 3.7 10*3/uL (ref 1.7–7.7)
Neutrophils Relative %: 63 %
Platelets: 256 10*3/uL (ref 150–400)
RBC: 5.24 MIL/uL — ABNORMAL HIGH (ref 3.87–5.11)
RDW: 12.8 % (ref 11.5–15.5)
WBC: 5.8 10*3/uL (ref 4.0–10.5)
nRBC: 0 % (ref 0.0–0.2)

## 2021-06-02 LAB — COMPREHENSIVE METABOLIC PANEL
ALT: 17 U/L (ref 0–44)
AST: 16 U/L (ref 15–41)
Albumin: 3.6 g/dL (ref 3.5–5.0)
Alkaline Phosphatase: 57 U/L (ref 38–126)
Anion gap: 10 (ref 5–15)
BUN: 11 mg/dL (ref 6–20)
CO2: 27 mmol/L (ref 22–32)
Calcium: 9.3 mg/dL (ref 8.9–10.3)
Chloride: 100 mmol/L (ref 98–111)
Creatinine, Ser: 0.64 mg/dL (ref 0.44–1.00)
GFR, Estimated: >60 mL/min (ref >60)
Glucose, Bld: 160 mg/dL — ABNORMAL HIGH (ref 70–99)
Potassium: 3.5 mmol/L (ref 3.5–5.1)
Sodium: 137 mmol/L (ref 135–145)
Total Bilirubin: 0.5 mg/dL (ref 0.3–1.2)
Total Protein: 7.7 g/dL (ref 6.5–8.1)

## 2021-06-02 LAB — MONONUCLEOSIS SCREEN: Mono Screen: NEGATIVE

## 2021-06-02 MED ORDER — LACTATED RINGERS IV SOLN: INTRAVENOUS | Status: DC

## 2021-06-02 MED ORDER — DIPHENHYDRAMINE HCL 50 MG/ML IJ SOLN
25.0000 mg | Freq: Once | INTRAMUSCULAR | Status: AC
  Administered 2021-06-02: 25 mg via INTRAVENOUS
  Filled 2021-06-02: qty 1

## 2021-06-02 MED ORDER — ACETAMINOPHEN 500 MG PO TABS
1000.0000 mg | ORAL_TABLET | Freq: Once | ORAL | Status: AC
  Administered 2021-06-02: 1000 mg via ORAL
  Filled 2021-06-02: qty 2

## 2021-06-02 MED ORDER — FAMOTIDINE 20 MG PO TABS
40.0000 mg | ORAL_TABLET | Freq: Once | ORAL | Status: AC
  Administered 2021-06-02: 40 mg via ORAL
  Filled 2021-06-02: qty 2

## 2021-06-02 NOTE — Discharge Instructions (Addendum)
Stop taking the clindamycin.  Restart the Augmentin.  Take Benadryl as directed.  Return here for any problems

## 2021-06-02 NOTE — ED Triage Notes (Signed)
Sore throat, was given augmentin, no change, was switched to clindamycin. Patient states they had oral thrush and ulcers. Says inside of chest is starting to itch. Patient is concerned for potential allergic reaction. raised red rash over majority of body.

## 2021-06-02 NOTE — ED Notes (Signed)
Pt ambulated to bathroom with no assistance.  

## 2021-06-02 NOTE — ED Provider Notes (Signed)
[facility]-EMERGENCY DEPT Provider Note   CSN: [id number] Arrival date & time: 06/02/21  1637     History Chief Complaint  Patient presents with   Shortness of Breath   Sore Throat   Rash    Tanya Parker is a 45 y.o. female.  45 year old female presents with 1 day of rash to her arms face and chest.  Being treated for tonsillitis with clindamycin.  She also used Magic mouthwash.  Denies any pruritus to this.  Thought that maybe she felt her throat was closing.  Did have emesis x1 but no associate abdominal discomfort.  No known allergies to any other new medications.  Denies any lesions around her lips.  No lesions on her hands or feet.  Nothing makes her symptoms better      Past Medical History:  Diagnosis Date   Anal fistula    Anxiety    Asthma    Bipolar disorder (HCC)    Chronic back pain    fell in tub few yrs ago   Diabetes mellitus without complication (HCC)    type 2   Hemorrhoids    Hypertension    Obesity    Right tennis elbow 01/03/2018   Seasonal allergies    Vitamin D deficiency     Patient Active Problem List   Diagnosis Date Noted   GAD (generalized anxiety disorder) 09/08/2018   Insomnia 09/08/2018   ADD (attention deficit disorder) 09/08/2018   Premenstrual dysphoric disorder 09/08/2018   Bipolar II disorder (HCC) 09/08/2018   Class 3 obesity with serious comorbidity and body mass index (BMI) of 45.0 to 49.9 in adult 06/25/2017   Hypercholesterolemia 06/25/2017   Hypertension, essential 06/25/2017   Type 2 diabetes mellitus with hyperglycemia, without long-term current use of insulin (HCC) 06/25/2017   Vitamin D insufficiency 06/25/2017    Past Surgical History:  Procedure Laterality Date   FISTULOTOMY  11/2017   Partial    FISTULOTOMY N/A 01/24/2018   Procedure: FISTULOTOMY;  Surgeon: Romie Levee, MD;  Location: [facility];  Service: General;  Laterality: N/A;   WISDOM TOOTH EXTRACTION        OB History   No obstetric history on file.     Family History  Problem Relation Age of Onset   Depression Mother    Anxiety disorder Father    Anxiety disorder Daughter    Sexual abuse Other    Depression Other    Anxiety disorder Other    Paranoid behavior Maternal Grandmother    Bipolar disorder Cousin    Anxiety disorder Cousin     Social History   Tobacco Use   Smoking status: Former    Packs/day: 0.25    Years: 2.00    Pack years: 0.50    Types: Cigarettes   Smokeless tobacco: Never   Tobacco comments:    quit sept 2018  Vaping Use   Vaping Use: Never used  Substance Use Topics   Alcohol use: No   Drug use: No    Home Medications Prior to Admission medications   Medication Sig Start Date End Date Taking? Authorizing Provider  Ascorbic Acid (VITAMIN C) 100 MG tablet Take 200 mg by mouth daily.    [provider]  busPIRone (BUSPAR) 15 MG tablet Take 1 tablet (15 mg total) by mouth 2 (two) times daily. 12/30/20   Corie Chiquito, PMHNP  cholecalciferol (VITAMIN D) 1000 units tablet Take 1,000 Units by mouth daily.    [provider]  citalopram (CELEXA) 10 MG tablet Take 10 mg by mouth daily and then increase to 20 mg daily for one week before menstrual period starts and then go back to 10 mg daily 12/30/20   Corie Chiquito, PMHNP  hydrOXYzine (VISTARIL) 50 MG capsule Take 1 capsule by mouth at bedtime 02/11/21   Corie Chiquito, PMHNP  lamoTRIgine (LAMICTAL) 150 MG tablet Take 1 tablet (150 mg total) by mouth daily. 12/30/20   Corie Chiquito, PMHNP  lisdexamfetamine (VYVANSE) 40 MG capsule Take 1 capsule (40 mg total) by mouth every morning. 02/10/21 03/12/21  Corie Chiquito, PMHNP  lisdexamfetamine (VYVANSE) 40 MG capsule Take 1 capsule (40 mg total) by mouth every morning. 01/13/21   Corie Chiquito, PMHNP  lisdexamfetamine (VYVANSE) 40 MG capsule Take 1 capsule (40 mg total) by mouth every morning. 04/29/21   Corie Chiquito, PMHNP   lisinopril-hydrochlorothiazide (PRINZIDE,ZESTORETIC) 20-12.5 MG tablet Take 1 tablet by mouth daily.    [provider]  meloxicam (MOBIC) 15 MG tablet Take 1 tablet (15 mg total) by mouth daily. Patient not taking: Reported on 06/28/2020 05/24/20   Kem Boroughs B, FNP  methocarbamol (ROBAXIN) 500 MG tablet Take 1 tablet (500 mg total) by mouth every 8 (eight) hours as needed for muscle spasms. Patient not taking: Reported on 12/30/2020 05/24/20   Chinita Pester, FNP  oxcarbazepine (TRILEPTAL) 600 MG tablet Take 0.5 tablets (300 mg total) by mouth in the morning AND 1.5 tablets (900 mg total) at bedtime. 12/30/20 03/30/21  Corie Chiquito, PMHNP  SYNJARDY 02-999 MG TABS Take 1 tablet by mouth 2 (two) times daily. 01/28/20   [provider]  VENTOLIN HFA 108 (90 Base) MCG/ACT inhaler Inhale 2 puffs into the lungs every 6 (six) hours as needed for wheezing or shortness of breath.  02/19/18   [provider]    Allergies    Prednisone  Review of Systems   Review of Systems  All other systems reviewed and are negative.  Physical Exam Updated Vital Signs BP (!) 165/111 (BP Location: Left Arm)   Pulse (!) 108   Temp 98.5 F (36.9 C) (Oral)   Resp 16   Ht 1.803 m (5\' 11" )   Wt 120.7 kg   SpO2 99%   BMI 37.10 kg/m   Physical Exam Vitals and nursing note reviewed.  Constitutional:      General: She is not in acute distress.    Appearance: Normal appearance. She is well-developed. She is not toxic-appearing.  HENT:     Head: Normocephalic and atraumatic.  Eyes:     General: Lids are normal.     Conjunctiva/sclera: Conjunctivae normal.     Pupils: Pupils are equal, round, and reactive to light.  Neck:     Thyroid: No thyroid mass.     Trachea: No tracheal deviation.  Cardiovascular:     Rate and Rhythm: Normal rate and regular rhythm.     Heart sounds: Normal heart sounds. No murmur heard.   No gallop.  Pulmonary:     Effort: Pulmonary effort is normal.  No respiratory distress.     Breath sounds: Normal breath sounds. No stridor. No decreased breath sounds, wheezing, rhonchi or rales.  Abdominal:     General: There is no distension.     Palpations: Abdomen is soft.     Tenderness: There is no abdominal tenderness. There is no rebound.  Musculoskeletal:        General: No tenderness. Normal range of motion.  Cervical back: Normal range of motion and neck supple.  Skin:    General: Skin is warm and dry.     Findings: Rash present. No abrasion. Rash is macular and papular. Rash is not purpuric.  Neurological:     Mental Status: She is alert and oriented to person, place, and time. Mental status is at baseline.     GCS: GCS eye subscore is 4. GCS verbal subscore is 5. GCS motor subscore is 6.     Cranial Nerves: Cranial nerves are intact. No cranial nerve deficit.     Sensory: No sensory deficit.     Motor: Motor function is intact.  Psychiatric:        Attention and Perception: Attention normal.        Speech: Speech normal.        Behavior: Behavior normal.    ED Results / Procedures / Treatments   Labs (all labs ordered are listed, but only abnormal results are displayed) Labs Reviewed  CBC WITH DIFFERENTIAL/PLATELET  COMPREHENSIVE METABOLIC PANEL    EKG EKG Interpretation  Date/Time:  Thursday June 02 2021 16:57:09 EDT Ventricular Rate:  104 PR Interval:  157 QRS Duration: 84 QT Interval:  335 QTC Calculation: 441 R Axis:   39 Text Interpretation: Sinus tachycardia Low voltage, precordial leads Confirmed by Lorre Nick ([id number]) on 06/02/2021 4:59:07 PM  Radiology No results found.  Procedures Procedures   Medications Ordered in ED Medications  lactated ringers infusion (has no administration in time range)  famotidine (PEPCID) tablet 40 mg (has no administration in time range)  diphenhydrAMINE (BENADRYL) injection 25 mg (has no administration in time range)    ED Course  I have reviewed the triage  vital signs and the nursing notes.  Pertinent labs & imaging results that were available during my care of the patient were reviewed by me and considered in my medical decision making (see chart for details).    MDM Rules/Calculators/A&P                           Patient states allergy to corticosteroids.  Was given Pepcid and Benadryl.  Labs are reassuring.  Rash might be slightly better.  Monospot negative.  Patient instructed to stop taking clindamycin as that is the last medication she recently started her symptoms began after that.  Patient will go back to taking Augmentin.  I will also give her ENT referral Final Clinical Impression(s) / ED Diagnoses Final diagnoses:  None    Rx / DC Orders ED Discharge Orders     None        Lorre Nick, MD 06/02/21 2035

## 2021-06-03 DIAGNOSIS — J343 Hypertrophy of nasal turbinates: Secondary | ICD-10-CM | POA: Diagnosis not present

## 2021-06-03 DIAGNOSIS — J039 Acute tonsillitis, unspecified: Secondary | ICD-10-CM | POA: Diagnosis not present

## 2021-06-06 ENCOUNTER — Other Ambulatory Visit: Payer: Self-pay

## 2021-06-06 ENCOUNTER — Ambulatory Visit (INDEPENDENT_AMBULATORY_CARE_PROVIDER_SITE_OTHER): Payer: BC Managed Care – PPO | Admitting: Psychiatry

## 2021-06-06 ENCOUNTER — Encounter: Payer: Self-pay | Admitting: Psychiatry

## 2021-06-06 DIAGNOSIS — F3181 Bipolar II disorder: Secondary | ICD-10-CM

## 2021-06-06 DIAGNOSIS — F411 Generalized anxiety disorder: Secondary | ICD-10-CM

## 2021-06-06 DIAGNOSIS — F5081 Binge eating disorder: Secondary | ICD-10-CM | POA: Diagnosis not present

## 2021-06-06 DIAGNOSIS — F900 Attention-deficit hyperactivity disorder, predominantly inattentive type: Secondary | ICD-10-CM | POA: Diagnosis not present

## 2021-06-06 DIAGNOSIS — F3281 Premenstrual dysphoric disorder: Secondary | ICD-10-CM

## 2021-06-06 MED ORDER — BUSPIRONE HCL 15 MG PO TABS: 15.0000 mg | ORAL_TABLET | Freq: Two times a day (BID) | ORAL | 1 refills | Status: DC

## 2021-06-06 MED ORDER — CITALOPRAM HYDROBROMIDE 10 MG PO TABS: ORAL_TABLET | ORAL | 1 refills | Status: DC

## 2021-06-06 MED ORDER — LISDEXAMFETAMINE DIMESYLATE 40 MG PO CAPS: 40.0000 mg | ORAL_CAPSULE | ORAL | 0 refills | Status: DC

## 2021-06-06 MED ORDER — LAMOTRIGINE 150 MG PO TABS: 150.0000 mg | ORAL_TABLET | Freq: Every day | ORAL | 1 refills | Status: DC

## 2021-06-06 MED ORDER — OXCARBAZEPINE 600 MG PO TABS: ORAL_TABLET | ORAL | 1 refills | Status: DC

## 2021-06-06 NOTE — Progress Notes (Signed)
Tanya MaxinMelissa K Parker [id number] 02/11/1976 44 y.o.  Subjective:   Patient ID:  Tanya MaxinMelissa K Parker is a 45 y.o. (DOB 02/11/1976) female.  Chief Complaint:  Chief Complaint  Patient presents with   Follow-up    Anxiety, mood disturbance, ADHD, insomnia     HPI Tanya Parker presents to the office today for follow-up of anxiety, mood disturbance, insomnia, and ADD. Step-daughter and her baby moved in with them in January. Tanya Parker noticed she was spending time away from house due to conflict. Husband kicked daughter out about 2 weeks ago.   She has had tonsillitis and had allergic reaction to an antibiotic. Was out 2 weeks. Had sores in her mouth and on her tongue. Reports that she was prescribed #5 tabs for pain and has one tab remaining.   Mood has been stable overall. Denies any significant depression or elevated moods as long as she does not miss medication doses. She reports that her anxiety has been manageable despite several stressors. She reports that her energy and motivation have improved. Concentration has been good. Sleep has been disrupted due to illness. She reports that sleep has improved overall with OTC supplement. Appetite has been good. Denies SI.   She reports that PMDD s/s have been well controlled.   It is now just she and her husband at home. She reports that she is not sure if she wants to go to marriage counseling. They have been married 12 years.  Expecting first grandchild.   Has not used marijuana in over 130 days.   Past medication trials:  Citalopram Trileptal Lamictal-helpful for mood Remeron-effective but caused weight gain Seroquel-excessive somnolence Rexulti-increase glucose, helpful for mood and anxiety Xanax Ambien Trazodone Concerta-helpful Adderall-increased heart rate Ritalin Hydroxyzine BuSpar-effective for anxiety    Flowsheet Row ED from 06/02/2021 in [facility]-EMERGENCY DEPT  C-SSRS RISK CATEGORY No Risk         Review of Systems:  Review of Systems  Constitutional:  Positive for fatigue.  HENT:  Positive for sore throat.   Cardiovascular:  Negative for palpitations.  Musculoskeletal:  Negative for gait problem.  Psychiatric/Behavioral:         Please refer to HPI   Medications: I have reviewed the patient's current medications.  Current Outpatient Medications  Medication Sig Dispense Refill   Ascorbic Acid (VITAMIN C) 100 MG tablet Take 200 mg by mouth daily.     cholecalciferol (VITAMIN D) 1000 units tablet Take 1,000 Units by mouth daily.     lisinopril-hydrochlorothiazide (PRINZIDE,ZESTORETIC) 20-12.5 MG tablet Take 1 tablet by mouth daily.     SYNJARDY 02-999 MG TABS Take 1 tablet by mouth 2 (two) times daily.     UNABLE TO FIND Power to Sleep (Melatonin, ashwagandha, valerian root, fish oil, lemon balm, passion flower)     VENTOLIN HFA 108 (90 Base) MCG/ACT inhaler Inhale 2 puffs into the lungs every 6 (six) hours as needed for wheezing or shortness of breath.      busPIRone (BUSPAR) 15 MG tablet Take 1 tablet (15 mg total) by mouth 2 (two) times daily. 180 tablet 1   citalopram (CELEXA) 10 MG tablet Take 10 mg by mouth daily and then increase to 20 mg daily for one week before menstrual period starts and then go back to 10 mg daily 135 tablet 1   lamoTRIgine (LAMICTAL) 150 MG tablet Take 1 tablet (150 mg total) by mouth daily. 90 tablet 1   [START ON 08/01/2021] lisdexamfetamine (VYVANSE) 40 MG  capsule Take 1 capsule (40 mg total) by mouth every morning. 30 capsule 0   [START ON 07/04/2021] lisdexamfetamine (VYVANSE) 40 MG capsule Take 1 capsule (40 mg total) by mouth every morning. 30 capsule 0   lisdexamfetamine (VYVANSE) 40 MG capsule Take 1 capsule (40 mg total) by mouth every morning. 30 capsule 0   meloxicam (MOBIC) 15 MG tablet Take 1 tablet (15 mg total) by mouth daily. (Patient not taking: Reported on 06/06/2021) 30 tablet 0   methocarbamol (ROBAXIN) 500 MG tablet Take 1 tablet  (500 mg total) by mouth every 8 (eight) hours as needed for muscle spasms. (Patient not taking: Reported on 12/30/2020) 30 tablet 0   oxcarbazepine (TRILEPTAL) 600 MG tablet Take 0.5 tablets (300 mg total) by mouth in the morning AND 1.5 tablets (900 mg total) at bedtime. 135 tablet 1   No current facility-administered medications for this visit.    Medication Side Effects: None  Allergies:  Allergies  Allergen Reactions   Prednisone    Clindamycin/Lincomycin Rash    Past Medical History:  Diagnosis Date   Anal fistula    Anxiety    Asthma    Bipolar disorder (HCC)    Chronic back pain    fell in tub few yrs ago   Diabetes mellitus without complication (HCC)    type 2   Hemorrhoids    Hypertension    Obesity    Right tennis elbow 01/03/2018   Seasonal allergies    Vitamin D deficiency     Past Medical History, Surgical history, Social history, and Family history were reviewed and updated as appropriate.   Please see review of systems for further details on the patient's review from today.   Objective:   Physical Exam:  BP 125/81   Pulse 95   Wt 267 lb (121.1 kg)   BMI 37.24 kg/m   Physical Exam Constitutional:      General: She is not in acute distress. Musculoskeletal:        General: No deformity.  Neurological:     Mental Status: She is alert and oriented to person, place, and time.     Coordination: Coordination normal.  Psychiatric:        Attention and Perception: Attention and perception normal. She does not perceive auditory or visual hallucinations.        Mood and Affect: Mood normal. Mood is not anxious or depressed. Affect is not labile, blunt, angry or inappropriate.        Speech: Speech normal.        Behavior: Behavior normal.        Thought Content: Thought content normal. Thought content is not paranoid or delusional. Thought content does not include homicidal or suicidal ideation. Thought content does not include homicidal or suicidal plan.         Cognition and Memory: Cognition and memory normal.        Judgment: Judgment normal.     Comments: Insight intact    Lab Review:     Component Value Date/Time   NA 137 06/02/2021 1724   K 3.5 06/02/2021 1724   CL 100 06/02/2021 1724   CO2 27 06/02/2021 1724   GLUCOSE 160 (H) 06/02/2021 1724   BUN 11 06/02/2021 1724   CREATININE 0.64 06/02/2021 1724   CALCIUM 9.3 06/02/2021 1724   PROT 7.7 06/02/2021 1724   ALBUMIN 3.6 06/02/2021 1724   AST 16 06/02/2021 1724   ALT 17 06/02/2021 1724   ALKPHOS  57 06/02/2021 1724   BILITOT 0.5 06/02/2021 1724   GFRNONAA >60 06/02/2021 1724   GFRAA >60 05/24/2020 0836       Component Value Date/Time   WBC 5.8 06/02/2021 1724   RBC 5.24 (H) 06/02/2021 1724   HGB 15.5 (H) 06/02/2021 1724   HCT 46.0 06/02/2021 1724   PLT 256 06/02/2021 1724   MCV 87.8 06/02/2021 1724   MCH 29.6 06/02/2021 1724   MCHC 33.7 06/02/2021 1724   RDW 12.8 06/02/2021 1724   LYMPHSABS 1.6 06/02/2021 1724   MONOABS 0.4 06/02/2021 1724   EOSABS 0.1 06/02/2021 1724   BASOSABS 0.0 06/02/2021 1724    No results found for: POCLITH, LITHIUM   No results found for: PHENYTOIN, PHENOBARB, VALPROATE, CBMZ   .res Assessment: Plan:    Will continue current plan of care since target signs and symptoms are well controlled without any tolerability issues. Will remove hydroxyzine from med list since it has not been used recently.  Continue Celexa 10 mg po qd and increase to 20 mg po qd one week prior to onset of menses.  Continue Vyvanse 40 mg po qd for ADHD.  Continue Buspar 15 mg po BID for anxiety.  Continue Trileptal 300 mg po q am and 900 mg po QHS since this has been helpful for her anxiety and mood s/s.  Continue Lamictal 150 mg po qd for mood s/s.  Pt to follow-up in 3 months or sooner if clinically indicated.  Patient advised to contact office with any questions, adverse effects, or acute worsening in signs and symptoms.   Tanya Parker was seen today for  follow-up.  Diagnoses and all orders for this visit:  Attention deficit hyperactivity disorder (ADHD), predominantly inattentive type -     lisdexamfetamine (VYVANSE) 40 MG capsule; Take 1 capsule (40 mg total) by mouth every morning. -     lisdexamfetamine (VYVANSE) 40 MG capsule; Take 1 capsule (40 mg total) by mouth every morning. -     lisdexamfetamine (VYVANSE) 40 MG capsule; Take 1 capsule (40 mg total) by mouth every morning.  Binge eating disorder -     lisdexamfetamine (VYVANSE) 40 MG capsule; Take 1 capsule (40 mg total) by mouth every morning.  Generalized anxiety disorder -     busPIRone (BUSPAR) 15 MG tablet; Take 1 tablet (15 mg total) by mouth 2 (two) times daily. -     citalopram (CELEXA) 10 MG tablet; Take 10 mg by mouth daily and then increase to 20 mg daily for one week before menstrual period starts and then go back to 10 mg daily  Bipolar II disorder (HCC) -     citalopram (CELEXA) 10 MG tablet; Take 10 mg by mouth daily and then increase to 20 mg daily for one week before menstrual period starts and then go back to 10 mg daily -     lamoTRIgine (LAMICTAL) 150 MG tablet; Take 1 tablet (150 mg total) by mouth daily. -     oxcarbazepine (TRILEPTAL) 600 MG tablet; Take 0.5 tablets (300 mg total) by mouth in the morning AND 1.5 tablets (900 mg total) at bedtime.  Premenstrual dysphoric disorder -     citalopram (CELEXA) 10 MG tablet; Take 10 mg by mouth daily and then increase to 20 mg daily for one week before menstrual period starts and then go back to 10 mg daily    Please see After Visit Summary for patient specific instructions.  Future Appointments  Date Time Provider Department Center  09/05/2021 12:45 PM Corie Chiquito, PMHNP [facility] None    No orders of the defined types were placed in this encounter.   -------------------------------

## 2021-07-21 DIAGNOSIS — E1169 Type 2 diabetes mellitus with other specified complication: Secondary | ICD-10-CM | POA: Diagnosis not present

## 2021-07-21 DIAGNOSIS — E1142 Type 2 diabetes mellitus with diabetic polyneuropathy: Secondary | ICD-10-CM | POA: Diagnosis not present

## 2021-07-21 DIAGNOSIS — E1159 Type 2 diabetes mellitus with other circulatory complications: Secondary | ICD-10-CM | POA: Diagnosis not present

## 2021-07-21 DIAGNOSIS — Z79899 Other long term (current) drug therapy: Secondary | ICD-10-CM | POA: Diagnosis not present

## 2021-08-05 DIAGNOSIS — I10 Essential (primary) hypertension: Secondary | ICD-10-CM | POA: Diagnosis not present

## 2021-08-05 DIAGNOSIS — Z23 Encounter for immunization: Secondary | ICD-10-CM | POA: Diagnosis not present

## 2021-08-05 DIAGNOSIS — Z1211 Encounter for screening for malignant neoplasm of colon: Secondary | ICD-10-CM | POA: Diagnosis not present

## 2021-08-05 DIAGNOSIS — J45909 Unspecified asthma, uncomplicated: Secondary | ICD-10-CM | POA: Diagnosis not present

## 2021-09-05 ENCOUNTER — Ambulatory Visit: Payer: BC Managed Care – PPO | Admitting: Psychiatry

## 2021-09-14 ENCOUNTER — Other Ambulatory Visit: Payer: Self-pay | Admitting: Psychiatry

## 2021-09-14 DIAGNOSIS — F900 Attention-deficit hyperactivity disorder, predominantly inattentive type: Secondary | ICD-10-CM

## 2021-09-14 MED ORDER — LISDEXAMFETAMINE DIMESYLATE 40 MG PO CAPS: 40.0000 mg | ORAL_CAPSULE | ORAL | 0 refills | Status: DC

## 2021-09-14 NOTE — Telephone Encounter (Signed)
Tanya Parker called to request refill of her Vyvanse.  Appt 11/03/21.  Send to [facility] on [address].

## 2021-11-03 ENCOUNTER — Encounter: Payer: Self-pay | Admitting: Psychiatry

## 2021-11-03 ENCOUNTER — Telehealth (INDEPENDENT_AMBULATORY_CARE_PROVIDER_SITE_OTHER): Payer: BC Managed Care – PPO | Admitting: Psychiatry

## 2021-11-03 DIAGNOSIS — F900 Attention-deficit hyperactivity disorder, predominantly inattentive type: Secondary | ICD-10-CM | POA: Diagnosis not present

## 2021-11-03 DIAGNOSIS — F5081 Binge eating disorder: Secondary | ICD-10-CM

## 2021-11-03 DIAGNOSIS — F3281 Premenstrual dysphoric disorder: Secondary | ICD-10-CM | POA: Diagnosis not present

## 2021-11-03 DIAGNOSIS — F3181 Bipolar II disorder: Secondary | ICD-10-CM

## 2021-11-03 DIAGNOSIS — F411 Generalized anxiety disorder: Secondary | ICD-10-CM

## 2021-11-03 MED ORDER — CITALOPRAM HYDROBROMIDE 10 MG PO TABS: ORAL_TABLET | ORAL | 1 refills | Status: DC

## 2021-11-03 MED ORDER — OXCARBAZEPINE 600 MG PO TABS: ORAL_TABLET | ORAL | 1 refills | Status: DC

## 2021-11-03 MED ORDER — LISDEXAMFETAMINE DIMESYLATE 40 MG PO CAPS: 40.0000 mg | ORAL_CAPSULE | ORAL | 0 refills | Status: DC

## 2021-11-03 MED ORDER — LAMOTRIGINE 150 MG PO TABS: 150.0000 mg | ORAL_TABLET | Freq: Every day | ORAL | 1 refills | Status: DC

## 2021-11-03 MED ORDER — BUSPIRONE HCL 15 MG PO TABS: ORAL_TABLET | ORAL | 1 refills | Status: DC

## 2021-11-03 NOTE — Progress Notes (Signed)
Tanya Parker [id number] [birth year] 46 y.o.  Virtual Visit via Video Note  I connected with pt @ on 11/03/21 at  4:00 PM EST by a video enabled telemedicine application and verified that I am speaking with the correct person using two identifiers.   I discussed the limitations of evaluation and management by telemedicine and the availability of in person appointments. The patient expressed understanding and agreed to proceed.  I discussed the assessment and treatment plan with the patient. The patient was provided an opportunity to ask questions and all were answered. The patient agreed with the plan and demonstrated an understanding of the instructions.   The patient was advised to call back or seek an in-person evaluation if the symptoms worsen or if the condition fails to improve as anticipated.  I provided 30 minutes of non-face-to-face time during this encounter.  The patient was located at home.  The provider was located at [place].   Tanya Parker, PMHNP   Subjective:   Patient ID:  Tanya Parker is a 46 y.o. (DOB 10/23/1975) female.  Chief Complaint:  Chief Complaint  Patient presents with   Follow-up    Anxiety, mood disturbance, insomnia, ADHD    HPI Tanya Parker presents for follow-up of anxiety, mood disturbance, ADHD, and insomnia.    She reports that she and her husband separated about 5 months ago. She reports that she met someone after they separated. She reports that this has been a big change. They moved to [place], [place]. She reports that she now will "feel at peace" at home and that she has a partner, Tanya Parker. He has 2 daughters (78 and 74 yo) in [place]. Family members have commented that she seems happy.   Daughter had her baby. He was born a month early.   Monday before Christmas her father was coughing up blood. He has been dx'd with Stage IV metastatic breast cancer. Multiple family members, including her father, had COVID.  Father has started chemotherapy. She is now an 1.5 away from them.   She has stayed on at her current job. She has accepted a new position with [employer] and will start on Monday.   Reports taking Hydroxyzine prn for the first time in months last night after conversation about her father. She reports that she felt groggy/hungover after taking it. She reports that she would rather distract herself from anxious thoughts.   She reports that her mood has been good. Denies depressed mood. Denies elevated mood. "Everything is really even." She reports that she is resting better but continues to wake up frequently. Estimates 4-5 hours of sleep a night. She reports that her appetite has been good. She denies any binge eating. Energy and motivation has been good. She reports increased enjoyment in things. She reports that her concentration is impaired- "my brain is just everywhere all the time." Denies SI.   Past medication trials:  Citalopram Trileptal Lamictal-helpful for mood Remeron-effective but caused weight gain Seroquel-excessive somnolence Rexulti-increase glucose, helpful for mood and anxiety Xanax Ambien Trazodone Concerta-helpful Adderall-increased heart rate Ritalin Hydroxyzine BuSpar-effective for anxiety  Review of Systems:  Review of Systems  Cardiovascular:  Negative for palpitations.  Musculoskeletal:  Negative for gait problem.  Neurological:  Negative for tremors.  Psychiatric/Behavioral:         Please refer to HPI   Medications: I have reviewed the patient's current medications.  Current Outpatient Medications  Medication Sig Dispense Refill   lisdexamfetamine (VYVANSE) 40 MG capsule Take  1 capsule (40 mg total) by mouth every morning. 30 capsule 0   lisinopril-hydrochlorothiazide (PRINZIDE,ZESTORETIC) 20-12.5 MG tablet Take 1 tablet by mouth daily.     metFORMIN (GLUCOPHAGE) 1000 MG tablet Take 1,000 mg by mouth 2 (two) times daily.     MOUNJARO 2.5 MG/0.5ML Pen  SMARTSIG:2.5 Milligram(s) SUB-Q Once a Week     UNABLE TO FIND at bedtime as needed. Power to Sleep (Melatonin, ashwagandha, valerian root, fish oil, lemon balm, passion flower)     VENTOLIN HFA 108 (90 Base) MCG/ACT inhaler Inhale 2 puffs into the lungs every 6 (six) hours as needed for wheezing or shortness of breath.      Ascorbic Acid (VITAMIN C) 100 MG tablet Take 200 mg by mouth daily. (Patient not taking: Reported on 11/03/2021)     busPIRone (BUSPAR) 15 MG tablet Take 1-1.5 tabs po BID 270 tablet 1   cholecalciferol (VITAMIN D) 1000 units tablet Take 1,000 Units by mouth daily. (Patient not taking: Reported on 11/03/2021)     citalopram (CELEXA) 10 MG tablet Take 10 mg by mouth daily and then increase to 20 mg daily for one week before menstrual period starts and then go back to 10 mg daily 135 tablet 1   lamoTRIgine (LAMICTAL) 150 MG tablet Take 1 tablet (150 mg total) by mouth daily. 90 tablet 1   [START ON 01/04/2022] lisdexamfetamine (VYVANSE) 40 MG capsule Take 1 capsule (40 mg total) by mouth every morning. 30 capsule 0   [START ON 12/07/2021] lisdexamfetamine (VYVANSE) 40 MG capsule Take 1 capsule (40 mg total) by mouth every morning. 30 capsule 0   [START ON 11/09/2021] lisdexamfetamine (VYVANSE) 40 MG capsule Take 1 capsule (40 mg total) by mouth every morning. 30 capsule 0   meloxicam (MOBIC) 15 MG tablet Take 1 tablet (15 mg total) by mouth daily. (Patient not taking: Reported on 06/06/2021) 30 tablet 0   methocarbamol (ROBAXIN) 500 MG tablet Take 1 tablet (500 mg total) by mouth every 8 (eight) hours as needed for muscle spasms. (Patient not taking: Reported on 12/30/2020) 30 tablet 0   oxcarbazepine (TRILEPTAL) 600 MG tablet Take 0.5 tablets (300 mg total) by mouth in the morning AND 1.5 tablets (900 mg total) at bedtime. 135 tablet 1   SYNJARDY 02-999 MG TABS Take 1 tablet by mouth 2 (two) times daily. (Patient not taking: Reported on 11/03/2021)     No current facility-administered  medications for this visit.    Medication Side Effects: None  Allergies:  Allergies  Allergen Reactions   Prednisone    Clindamycin/Lincomycin Rash    Past Medical History:  Diagnosis Date   Anal fistula    Anxiety    Asthma    Bipolar disorder (HCC)    Chronic back pain    fell in tub few yrs ago   Diabetes mellitus without complication ([place])    type 2   Hemorrhoids    Hypertension    Obesity    Right tennis elbow 01/03/2018   Seasonal allergies    Vitamin D deficiency     Family History  Problem Relation Age of Onset   Depression Mother    Anxiety disorder Father    Anxiety disorder Daughter    Sexual abuse Other    Depression Other    Anxiety disorder Other    Paranoid behavior Maternal Grandmother    Bipolar disorder Cousin    Anxiety disorder Cousin     Social History   Socioeconomic History  Marital status: Married    Spouse name: Leroy Sea   Number of children: 3   Years of education: 4   Highest education level: 12th grade  Occupational History   Not on file  Tobacco Use   Smoking status: Former    Packs/day: 0.25    Years: 2.00    Pack years: 0.50    Types: Cigarettes   Smokeless tobacco: Never   Tobacco comments:    quit sept 2018  Vaping Use   Vaping Use: Never used  Substance and Sexual Activity   Alcohol use: No   Drug use: No   Sexual activity: Yes    Partners: Male  Other Topics Concern   Not on file  Social History Narrative   Not on file   Social Determinants of Health   Financial Resource Strain: Not on file  Food Insecurity: Not on file  Transportation Needs: Not on file  Physical Activity: Not on file  Stress: Not on file  Social Connections: Not on file  Intimate Partner Violence: Not on file    Past Medical History, Surgical history, Social history, and Family history were reviewed and updated as appropriate.   Please see review of systems for further details on the patient's review from today.   Objective:    Physical Exam:  Wt 266 lb (120.7 kg)    BMI 37.10 kg/m   Physical Exam Neurological:     Mental Status: She is alert and oriented to person, place, and time.     Cranial Nerves: No dysarthria.  Psychiatric:        Attention and Perception: Attention and perception normal.        Mood and Affect: Mood normal.        Speech: Speech normal.        Behavior: Behavior is cooperative.        Thought Content: Thought content normal. Thought content is not paranoid or delusional. Thought content does not include homicidal or suicidal ideation. Thought content does not include homicidal or suicidal plan.        Cognition and Memory: Cognition and memory normal.        Judgment: Judgment normal.     Comments: Insight intact Anxious when talking about father's diagnosis    Lab Review:     Component Value Date/Time   NA 137 06/02/2021 1724   K 3.5 06/02/2021 1724   CL 100 06/02/2021 1724   CO2 27 06/02/2021 1724   GLUCOSE 160 (H) 06/02/2021 1724   BUN 11 06/02/2021 1724   CREATININE 0.64 06/02/2021 1724   CALCIUM 9.3 06/02/2021 1724   PROT 7.7 06/02/2021 1724   ALBUMIN 3.6 06/02/2021 1724   AST 16 06/02/2021 1724   ALT 17 06/02/2021 1724   ALKPHOS 57 06/02/2021 1724   BILITOT 0.5 06/02/2021 1724   GFRNONAA >60 06/02/2021 1724   GFRAA >60 05/24/2020 0836       Component Value Date/Time   WBC 5.8 06/02/2021 1724   RBC 5.24 (H) 06/02/2021 1724   HGB 15.5 (H) 06/02/2021 1724   HCT 46.0 06/02/2021 1724   PLT 256 06/02/2021 1724   MCV 87.8 06/02/2021 1724   MCH 29.6 06/02/2021 1724   MCHC 33.7 06/02/2021 1724   RDW 12.8 06/02/2021 1724   LYMPHSABS 1.6 06/02/2021 1724   MONOABS 0.4 06/02/2021 1724   EOSABS 0.1 06/02/2021 1724   BASOSABS 0.0 06/02/2021 1724    No results found for: POCLITH, LITHIUM   No results  found for: PHENYTOIN, PHENOBARB, VALPROATE, CBMZ   .res Assessment: Plan:   Pt seen for 30 minutes and time spent discussing recent anxiety in response to  father being dx'd with Stage IV lung disease. Discussed increasing Buspar to 15 mg 1.5 tabs twice daily to improve anxiety and pt is in agreement with this.  Will continue Celexa 10 mg po qd with increase to 20 mg po qd one week before onset of menses for mood and anxiety s/s.  Continue Lamictal 150 mg po qd for mood s/s.  Continue Trileptal 300 mg in the morning and 900 mg at bedtime for mood and anxiety s/s. Continue Vyvanse 40 mg po qd for ADHD and binge eating.  Pt to follow-up in 3 months or sooner if clinically indicated.  Patient advised to contact office with any questions, adverse effects, or acute worsening in signs and symptoms.   Ninah was seen today for follow-up.  Diagnoses and all orders for this visit:  Generalized anxiety disorder -     busPIRone (BUSPAR) 15 MG tablet; Take 1-1.5 tabs po BID -     citalopram (CELEXA) 10 MG tablet; Take 10 mg by mouth daily and then increase to 20 mg daily for one week before menstrual period starts and then go back to 10 mg daily  Bipolar II disorder (HCC) -     lamoTRIgine (LAMICTAL) 150 MG tablet; Take 1 tablet (150 mg total) by mouth daily. -     citalopram (CELEXA) 10 MG tablet; Take 10 mg by mouth daily and then increase to 20 mg daily for one week before menstrual period starts and then go back to 10 mg daily -     oxcarbazepine (TRILEPTAL) 600 MG tablet; Take 0.5 tablets (300 mg total) by mouth in the morning AND 1.5 tablets (900 mg total) at bedtime.  Premenstrual dysphoric disorder -     citalopram (CELEXA) 10 MG tablet; Take 10 mg by mouth daily and then increase to 20 mg daily for one week before menstrual period starts and then go back to 10 mg daily  Attention deficit hyperactivity disorder (ADHD), predominantly inattentive type -     lisdexamfetamine (VYVANSE) 40 MG capsule; Take 1 capsule (40 mg total) by mouth every morning. -     lisdexamfetamine (VYVANSE) 40 MG capsule; Take 1 capsule (40 mg total) by mouth every  morning. -     lisdexamfetamine (VYVANSE) 40 MG capsule; Take 1 capsule (40 mg total) by mouth every morning.  Binge eating disorder -     lisdexamfetamine (VYVANSE) 40 MG capsule; Take 1 capsule (40 mg total) by mouth every morning.     Please see After Visit Summary for patient specific instructions.  No future appointments.  No orders of the defined types were placed in this encounter.     -------------------------------

## 2021-12-12 DIAGNOSIS — M25562 Pain in left knee: Secondary | ICD-10-CM | POA: Diagnosis not present

## 2021-12-12 DIAGNOSIS — S76112A Strain of left quadriceps muscle, fascia and tendon, initial encounter: Secondary | ICD-10-CM | POA: Diagnosis not present

## 2021-12-12 DIAGNOSIS — M25552 Pain in left hip: Secondary | ICD-10-CM | POA: Diagnosis not present

## 2021-12-12 DIAGNOSIS — S76311A Strain of muscle, fascia and tendon of the posterior muscle group at thigh level, right thigh, initial encounter: Secondary | ICD-10-CM | POA: Diagnosis not present

## 2021-12-15 DIAGNOSIS — R350 Frequency of micturition: Secondary | ICD-10-CM | POA: Diagnosis not present

## 2021-12-15 DIAGNOSIS — N3001 Acute cystitis with hematuria: Secondary | ICD-10-CM | POA: Diagnosis not present

## 2021-12-15 DIAGNOSIS — R300 Dysuria: Secondary | ICD-10-CM | POA: Diagnosis not present

## 2022-01-05 DIAGNOSIS — M25562 Pain in left knee: Secondary | ICD-10-CM | POA: Diagnosis not present

## 2022-01-05 DIAGNOSIS — M94262 Chondromalacia, left knee: Secondary | ICD-10-CM | POA: Diagnosis not present

## 2022-02-09 ENCOUNTER — Telehealth: Payer: Self-pay | Admitting: Psychiatry

## 2022-02-09 DIAGNOSIS — F900 Attention-deficit hyperactivity disorder, predominantly inattentive type: Secondary | ICD-10-CM

## 2022-02-09 MED ORDER — LISDEXAMFETAMINE DIMESYLATE 40 MG PO CAPS: 40.0000 mg | ORAL_CAPSULE | ORAL | 0 refills | Status: DC

## 2022-02-09 NOTE — Telephone Encounter (Signed)
Script sent  

## 2022-02-09 NOTE — Telephone Encounter (Signed)
Pt called and requested refill of Vyvanse to ?[facility] on [address] in [place], [place]. ? ?Next appt 5/18 ? ? ?

## 2022-02-16 DIAGNOSIS — I152 Hypertension secondary to endocrine disorders: Secondary | ICD-10-CM | POA: Diagnosis not present

## 2022-02-16 DIAGNOSIS — Z79899 Other long term (current) drug therapy: Secondary | ICD-10-CM | POA: Diagnosis not present

## 2022-02-16 DIAGNOSIS — E1142 Type 2 diabetes mellitus with diabetic polyneuropathy: Secondary | ICD-10-CM | POA: Diagnosis not present

## 2022-02-16 DIAGNOSIS — E1159 Type 2 diabetes mellitus with other circulatory complications: Secondary | ICD-10-CM | POA: Diagnosis not present

## 2022-02-16 DIAGNOSIS — E785 Hyperlipidemia, unspecified: Secondary | ICD-10-CM | POA: Diagnosis not present

## 2022-02-16 DIAGNOSIS — E1169 Type 2 diabetes mellitus with other specified complication: Secondary | ICD-10-CM | POA: Diagnosis not present

## 2022-02-20 DIAGNOSIS — E119 Type 2 diabetes mellitus without complications: Secondary | ICD-10-CM | POA: Diagnosis not present

## 2022-02-21 ENCOUNTER — Telehealth: Payer: Self-pay | Admitting: Psychiatry

## 2022-02-21 DIAGNOSIS — F411 Generalized anxiety disorder: Secondary | ICD-10-CM

## 2022-02-21 MED ORDER — LORAZEPAM 0.5 MG PO TABS: ORAL_TABLET | ORAL | 0 refills | Status: DC

## 2022-02-21 NOTE — Telephone Encounter (Signed)
Please see message from patient. In looking at the PMP database dating back to 03/2020 she has not been on a BZ.  ?

## 2022-02-21 NOTE — Telephone Encounter (Signed)
Please let her know a limited supply (#10 tabs) was sent to [facility] in [place] with instructions to take 1/2-1 tab BID prn anxiety.  ?

## 2022-02-21 NOTE — Telephone Encounter (Signed)
Tanya Parker called this morning at 10:00 to verify her appt 5/18 as mychart.  She is presently in [place].  Her father is being transitioned to hospice.  She is not handling well and has asked if you would prescribe her some Ativan.  She do NOT want Xanax or hydroxyzine.  She doesn't want to be too groggy during the day.  If you are ok with this, send to [facility] [address] in [place], [place]  ?

## 2022-02-21 NOTE — Telephone Encounter (Signed)
Patient notified

## 2022-02-23 ENCOUNTER — Encounter: Payer: Self-pay | Admitting: Psychiatry

## 2022-02-23 ENCOUNTER — Telehealth: Payer: BC Managed Care – PPO | Admitting: Psychiatry

## 2022-02-23 DIAGNOSIS — F3181 Bipolar II disorder: Secondary | ICD-10-CM | POA: Diagnosis not present

## 2022-02-23 DIAGNOSIS — F3281 Premenstrual dysphoric disorder: Secondary | ICD-10-CM

## 2022-02-23 DIAGNOSIS — F411 Generalized anxiety disorder: Secondary | ICD-10-CM

## 2022-02-23 DIAGNOSIS — F900 Attention-deficit hyperactivity disorder, predominantly inattentive type: Secondary | ICD-10-CM

## 2022-02-23 MED ORDER — CITALOPRAM HYDROBROMIDE 10 MG PO TABS: ORAL_TABLET | ORAL | 1 refills | Status: DC

## 2022-02-23 MED ORDER — OXCARBAZEPINE 600 MG PO TABS: ORAL_TABLET | ORAL | 1 refills | Status: DC

## 2022-02-23 MED ORDER — BUSPIRONE HCL 30 MG PO TABS: 30.0000 mg | ORAL_TABLET | Freq: Two times a day (BID) | ORAL | 0 refills | Status: DC

## 2022-02-23 MED ORDER — LAMOTRIGINE 150 MG PO TABS: 150.0000 mg | ORAL_TABLET | Freq: Every day | ORAL | 1 refills | Status: DC

## 2022-02-23 MED ORDER — LISDEXAMFETAMINE DIMESYLATE 40 MG PO CAPS: 40.0000 mg | ORAL_CAPSULE | ORAL | 0 refills | Status: DC

## 2022-02-23 NOTE — Progress Notes (Signed)
Tanya Parker BP:[id number] 03-01-1976 46 y.o.  Virtual Visit via Video Note  I connected with pt @ on 02/23/22 at  8:30 AM EDT by a video enabled telemedicine application and verified that I am speaking with the correct person using two identifiers.   I discussed the limitations of evaluation and management by telemedicine and the availability of in person appointments. The patient expressed understanding and agreed to proceed.  I discussed the assessment and treatment plan with the patient. The patient was provided an opportunity to ask questions and all were answered. The patient agreed with the plan and demonstrated an understanding of the instructions.   The patient was advised to call back or seek an in-person evaluation if the symptoms worsen or if the condition fails to improve as anticipated.  I provided 30 minutes of non-face-to-face time during this encounter.  The patient was located in her personal vehicle in [place], [place].  The provider was located at home.  Thayer Headings, PMHNP   Subjective:   Patient ID:  Tanya Parker is a 46 y.o. (DOB 30-Jul-1976) female.  Chief Complaint:  Chief Complaint  Patient presents with   Other    Acute grief   Follow-up    Anxiety, mood disturbance, insomnia, and ADHD    HPI Tanya Parker presents for follow-up of anxiety, mood disturbance, insomnia, and ADHD. She reports that her father was dx'd with Stage IV cancer and they were told he was making improvements, then 2 weeks later he had a rapid decline. Her father is now receiving Hospice services and is actively dying. She reports that she stayed with her father last night and he has started to have the death rattle. She reports that she had a full blown panic attack a couple of days ago. She reports that she was able to say everything that she wanted to tell her father and promised him that she would do everything to maintain her mental health and would not relapse on any  substances. She reports that she has not had any cravings to use. She reports that she had a brief thought of self harm and was able to redirect this. Denies SI.   She reports that things are going well at home and at work. She reports that she and her ex are now amicable.  She reports that her mood "has been great." Denies depressed mood other than sadness related to grief. She reports some increased impulsive spending. She reports that she usually sleeps 4-5 hours a night. Energy has been lower in response to father being in hospice. She reports that her energy and motivation have been good at work. She reports that she has had concentration difficulties recently with grief. She reports that concentration was ok prior to father's condition. Appetite has been ok. Denies binge eating. She reports that she has been snacking at night again and is trying to control this.   She reports that she took Hydroxyzine one night and felt "hungover" the following day.   She reports that she has been unable to sleep and that her "emotions are all over the place."   Vyvanse last filled 02/09/22.   Past medication trials:  Citalopram Trileptal Lamictal-helpful for mood Remeron-effective but caused weight gain Seroquel-excessive somnolence Rexulti-increase glucose, helpful for mood and anxiety Xanax Ambien Trazodone Concerta-helpful Adderall-increased heart rate Ritalin Hydroxyzine BuSpar-effective for anxiety  Review of Systems:  Review of Systems  Cardiovascular:  Negative for palpitations.  Endocrine:  Improved glycemic control  Musculoskeletal:  Negative for gait problem.  Psychiatric/Behavioral:         Please refer to HPI   Medications: I have reviewed the patient's current medications.  Current Outpatient Medications  Medication Sig Dispense Refill   lisdexamfetamine (VYVANSE) 40 MG capsule Take 1 capsule (40 mg total) by mouth every morning. 30 capsule 0   lisdexamfetamine (VYVANSE)  40 MG capsule Take 1 capsule (40 mg total) by mouth every morning. 30 capsule 0   lisdexamfetamine (VYVANSE) 40 MG capsule Take 1 capsule (40 mg total) by mouth every morning. 30 capsule 0   lisinopril (ZESTRIL) 40 MG tablet Take 40 mg by mouth daily.     LORazepam (ATIVAN) 0.5 MG tablet Take 1/2-1 tab po BID prn anxiety 10 tablet 0   metFORMIN (GLUCOPHAGE) 1000 MG tablet Take 1,000 mg by mouth 2 (two) times daily.     tirzepatide [facility](MOUNJARO) 5 MG/0.5ML Pen SMARTSIG:2.5 Milligram(s) SUB-Q Once a Week     UNABLE TO FIND at bedtime as needed. Power to Sleep (Melatonin, ashwagandha, valerian root, fish oil, lemon balm, passion flower)     busPIRone (BUSPAR) 30 MG tablet Take 1 tablet (30 mg total) by mouth 2 (two) times daily. 180 tablet 0   citalopram (CELEXA) 10 MG tablet Take 10 mg by mouth daily and then increase to 20 mg daily for one week before menstrual period starts and then go back to 10 mg daily 135 tablet 1   lamoTRIgine (LAMICTAL) 150 MG tablet Take 1 tablet (150 mg total) by mouth daily. 90 tablet 1   [START ON 03/09/2022] lisdexamfetamine (VYVANSE) 40 MG capsule Take 1 capsule (40 mg total) by mouth every morning. 30 capsule 0   oxcarbazepine (TRILEPTAL) 600 MG tablet Take 0.5 tablets (300 mg total) by mouth in the morning AND 1.5 tablets (900 mg total) at bedtime. 135 tablet 1   VENTOLIN HFA 108 (90 Base) MCG/ACT inhaler Inhale 2 puffs into the lungs every 6 (six) hours as needed for wheezing or shortness of breath.      No current facility-administered medications for this visit.    Medication Side Effects: None  Allergies:  Allergies  Allergen Reactions   Prednisone    Clindamycin/Lincomycin Rash    Past Medical History:  Diagnosis Date   Anal fistula    Anxiety    Asthma    Bipolar disorder (HCC)    Chronic back pain    fell in tub few yrs ago   Diabetes mellitus without complication (HCC)    type 2   Hemorrhoids    Hypertension    Obesity    Right tennis elbow  01/03/2018   Seasonal allergies    Vitamin D deficiency     Family History  Problem Relation Age of Onset   Depression Mother    Anxiety disorder Father    Anxiety disorder Daughter    Sexual abuse Other    Depression Other    Anxiety disorder Other    Paranoid behavior Maternal Grandmother    Bipolar disorder Cousin    Anxiety disorder Cousin     Social History   Socioeconomic History   Marital status: Married    Spouse name: Brad   Number of children: 3   Years of education: 4   Highest education level: 12th grade  Occupational History   Not on file  Tobacco Use   Smoking status: Former    Packs/day: 0.25    Years: 2.00  Pack years: 0.50    Types: Cigarettes   Smokeless tobacco: Never   Tobacco comments:    quit sept 2018  Vaping Use   Vaping Use: Never used  Substance and Sexual Activity   Alcohol use: No   Drug use: No   Sexual activity: Yes    Partners: Male  Other Topics Concern   Not on file  Social History Narrative   Not on file   Social Determinants of Health   Financial Resource Strain: Not on file  Food Insecurity: Not on file  Transportation Needs: Not on file  Physical Activity: Not on file  Stress: Not on file  Social Connections: Not on file  Intimate Partner Violence: Not on file    Past Medical History, Surgical history, Social history, and Family history were reviewed and updated as appropriate.   Please see review of systems for further details on the patient's review from today.   Objective:   Physical Exam:  Wt 248 lb (112.5 kg)   BMI 34.59 kg/m   Physical Exam Neurological:     Mental Status: She is alert and oriented to person, place, and time.     Cranial Nerves: No dysarthria.  Psychiatric:        Attention and Perception: Attention and perception normal.        Mood and Affect: Mood normal.        Speech: Speech normal.        Behavior: Behavior is cooperative.        Thought Content: Thought content normal.  Thought content is not paranoid or delusional. Thought content does not include homicidal or suicidal ideation. Thought content does not include homicidal or suicidal plan.        Cognition and Memory: Cognition and memory normal.        Judgment: Judgment normal.     Comments: Insight intact    Lab Review:     Component Value Date/Time   NA 137 06/02/2021 1724   K 3.5 06/02/2021 1724   CL 100 06/02/2021 1724   CO2 27 06/02/2021 1724   GLUCOSE 160 (H) 06/02/2021 1724   BUN 11 06/02/2021 1724   CREATININE 0.64 06/02/2021 1724   CALCIUM 9.3 06/02/2021 1724   PROT 7.7 06/02/2021 1724   ALBUMIN 3.6 06/02/2021 1724   AST 16 06/02/2021 1724   ALT 17 06/02/2021 1724   ALKPHOS 57 06/02/2021 1724   BILITOT 0.5 06/02/2021 1724   GFRNONAA >60 06/02/2021 1724   GFRAA >60 05/24/2020 0836       Component Value Date/Time   WBC 5.8 06/02/2021 1724   RBC 5.24 (H) 06/02/2021 1724   HGB 15.5 (H) 06/02/2021 1724   HCT 46.0 06/02/2021 1724   PLT 256 06/02/2021 1724   MCV 87.8 06/02/2021 1724   MCH 29.6 06/02/2021 1724   MCHC 33.7 06/02/2021 1724   RDW 12.8 06/02/2021 1724   LYMPHSABS 1.6 06/02/2021 1724   MONOABS 0.4 06/02/2021 1724   EOSABS 0.1 06/02/2021 1724   BASOSABS 0.0 06/02/2021 1724    No results found for: POCLITH, LITHIUM   No results found for: PHENYTOIN, PHENOBARB, VALPROATE, CBMZ   .res Assessment: Plan:   Pt seen for 30 minutes and time spent discussing response to father's sudden decline in health and anticipatory grief. Reviewed that limited supply of Lorazepam was sent in to help with acute anxiety and insomnia during this time. Discussed that sleep would likely improve in the future once acute stress resolves  and she agrees with this.  She asks about increase in Buspar. Discussed that Buspar is approved up to 30 mg po BID. Pt agrees to increase in Buspar to 30 mg po BID since she reports that previous increase was helpful for her anxiety.  Continue Celexa 10 mg po qd  and increase to 20 mg po qd one week prior to the onset of menses and resume 10 mg at start of menses.  Continue Vyvanse 40 mg po qd for ADHD.  Continue Lamictal 150 mg po qd for mood symptoms. Continue Trileptal 300 mg po q am and 900 mg po QHS for mood stabilization. Pt to follow-up with this provider in 4 weeks or sooner if clinically indicated.  Patient advised to contact office with any questions, adverse effects, or acute worsening in signs and symptoms.   Shaylen was seen today for other and follow-up.  Diagnoses and all orders for this visit:  Generalized anxiety disorder -     busPIRone (BUSPAR) 30 MG tablet; Take 1 tablet (30 mg total) by mouth 2 (two) times daily. -     citalopram (CELEXA) 10 MG tablet; Take 10 mg by mouth daily and then increase to 20 mg daily for one week before menstrual period starts and then go back to 10 mg daily  Bipolar II disorder (HCC) -     citalopram (CELEXA) 10 MG tablet; Take 10 mg by mouth daily and then increase to 20 mg daily for one week before menstrual period starts and then go back to 10 mg daily -     lamoTRIgine (LAMICTAL) 150 MG tablet; Take 1 tablet (150 mg total) by mouth daily. -     oxcarbazepine (TRILEPTAL) 600 MG tablet; Take 0.5 tablets (300 mg total) by mouth in the morning AND 1.5 tablets (900 mg total) at bedtime.  Premenstrual dysphoric disorder -     citalopram (CELEXA) 10 MG tablet; Take 10 mg by mouth daily and then increase to 20 mg daily for one week before menstrual period starts and then go back to 10 mg daily  Attention deficit hyperactivity disorder (ADHD), predominantly inattentive type -     lisdexamfetamine (VYVANSE) 40 MG capsule; Take 1 capsule (40 mg total) by mouth every morning.     Please see After Visit Summary for patient specific instructions.  No future appointments.  No orders of the defined types were placed in this encounter.     -------------------------------

## 2022-04-07 ENCOUNTER — Telehealth: Payer: Self-pay | Admitting: Psychiatry

## 2022-04-07 ENCOUNTER — Other Ambulatory Visit: Payer: Self-pay

## 2022-04-07 DIAGNOSIS — F900 Attention-deficit hyperactivity disorder, predominantly inattentive type: Secondary | ICD-10-CM

## 2022-04-07 DIAGNOSIS — F5081 Binge eating disorder: Secondary | ICD-10-CM

## 2022-04-07 MED ORDER — LISDEXAMFETAMINE DIMESYLATE 40 MG PO CAPS: 40.0000 mg | ORAL_CAPSULE | ORAL | 0 refills | Status: DC

## 2022-04-07 NOTE — Telephone Encounter (Signed)
Pt called at  4:57 pm asking for a refill on her vyvanse 40 mg. Pharmacy is [facility]  [address]. Next appt 8/1

## 2022-04-07 NOTE — Telephone Encounter (Signed)
Pended.

## 2022-05-09 ENCOUNTER — Encounter: Payer: Self-pay | Admitting: Psychiatry

## 2022-05-09 ENCOUNTER — Telehealth: Payer: BC Managed Care – PPO | Admitting: Psychiatry

## 2022-05-09 ENCOUNTER — Telehealth (INDEPENDENT_AMBULATORY_CARE_PROVIDER_SITE_OTHER): Payer: BC Managed Care – PPO | Admitting: Psychiatry

## 2022-05-09 VITALS — HR 77 | Wt 240.0 lb

## 2022-05-09 DIAGNOSIS — F3181 Bipolar II disorder: Secondary | ICD-10-CM

## 2022-05-09 DIAGNOSIS — F411 Generalized anxiety disorder: Secondary | ICD-10-CM | POA: Diagnosis not present

## 2022-05-09 DIAGNOSIS — F3281 Premenstrual dysphoric disorder: Secondary | ICD-10-CM

## 2022-05-09 DIAGNOSIS — F900 Attention-deficit hyperactivity disorder, predominantly inattentive type: Secondary | ICD-10-CM | POA: Diagnosis not present

## 2022-05-09 MED ORDER — LISDEXAMFETAMINE DIMESYLATE 40 MG PO CAPS: 40.0000 mg | ORAL_CAPSULE | ORAL | 0 refills | Status: DC

## 2022-05-09 MED ORDER — HYDROXYZINE PAMOATE 50 MG PO CAPS: 50.0000 mg | ORAL_CAPSULE | Freq: Every day | ORAL | 0 refills | Status: DC

## 2022-05-09 MED ORDER — LURASIDONE HCL 40 MG PO TABS
ORAL_TABLET | ORAL | 2 refills | Status: DC
Start: 2022-05-09 — End: 2022-08-15

## 2022-05-09 NOTE — Progress Notes (Signed)
Tanya Parker [id number] 10/24/1975 46 y.o.  Virtual Visit via Video Note  I connected with pt @ on 05/09/22 at  2:30 PM EDT by a video enabled telemedicine application and verified that I am speaking with the correct person using two identifiers.   I discussed the limitations of evaluation and management by telemedicine and the availability of in person appointments. The patient expressed understanding and agreed to proceed.  I discussed the assessment and treatment plan with the patient. The patient was provided an opportunity to ask questions and all were answered. The patient agreed with the plan and demonstrated an understanding of the instructions.   The patient was advised to call back or seek an in-person evaluation if the symptoms worsen or if the condition fails to improve as anticipated.  I provided 40 minutes of non-face-to-face time during this encounter.  The patient was located at home.  The provider was located at [facility].   Corie Chiquito, PMHNP   Subjective:   Patient ID:  Tanya Parker is a 46 y.o. (DOB 03-31-1976) female.  Chief Complaint:  Chief Complaint  Patient presents with   Anxiety   Insomnia    Anxiety Symptoms include insomnia.    Insomnia   Tanya Parker presents for follow-up of anxiety, mood disturbance, and insomnia.   She reports feeling tired. She reports difficulty with sleep- "I can't shut my brain off." Sleeping 2-4 hours a night. Denies racing thoughts.   She reports that her anxiety is elevated and attributes this to lack of sleep. Denies excessive worry. She reports some panic symptoms. Has not had a panic attack since father's death.   She reports depressed mood. Grieving the loss of her father and reports that she stays busy to avoid dwelling on grief. Low energy and motivation. She reports that she is "very snappy" at times and notices some irritability. Reports that she has severe irritability and  over-thinking, to include intrusive thoughts around menstrual cycle. She has been compulsively checking significant other's whereabouts on [employer] and worried about who he is talking to. She reports that she is waiting for something to go wrong. She reports poor concentration and will lose train of thought mid-sentence. She reports that she has been spending excessively. Some increased talkativeness. Has been cleaning excessively to include re-organizing. Appetite has been consistent. Has been snacking more at night. Denies any binge eating. Continues to lose weight with Mounjaro. Denies SI. Denies any further self-injurious behavior. Denies HI.  Had some thoughts of using when her father died and reports that she was able to talk herself through this.   Reports that her work has been going well. Has 2 days off during the week.   Son has been having difficulty that she left her ex-husband.   Past medication trials:  Citalopram Trileptal Lamictal-helpful for mood Remeron-effective but caused weight gain Seroquel-excessive somnolence Rexulti-increase glucose, helpful for mood and anxiety Xanax Ambien Trazodone Concerta-helpful Adderall-increased heart rate Ritalin Hydroxyzine BuSpar-effective for anxiety  Review of Systems:  Review of Systems  Gastrointestinal: Negative.   Genitourinary:  Positive for menstrual problem.       Heavy menstrual bleeding  Musculoskeletal:  Negative for gait problem.  Neurological:  Negative for tremors and headaches.  Psychiatric/Behavioral:  The patient has insomnia.        Please refer to HPI    Medications: I have reviewed the patient's current medications.  Current Outpatient Medications  Medication Sig Dispense Refill   busPIRone (BUSPAR)  30 MG tablet Take 1 tablet (30 mg total) by mouth 2 (two) times daily. 180 tablet 0   citalopram (CELEXA) 10 MG tablet Take 10 mg by mouth daily and then increase to 20 mg daily for one week before menstrual  period starts and then go back to 10 mg daily 135 tablet 1   lamoTRIgine (LAMICTAL) 150 MG tablet Take 1 tablet (150 mg total) by mouth daily. 90 tablet 1   lisinopril (ZESTRIL) 40 MG tablet Take 40 mg by mouth daily.     lurasidone (LATUDA) 40 MG TABS tablet Take 1/2 tablet daily with supper for one week, then increase to 1 tablet daily with supper 30 tablet 2   metFORMIN (GLUCOPHAGE) 1000 MG tablet Take 1,000 mg by mouth 2 (two) times daily.     oxcarbazepine (TRILEPTAL) 600 MG tablet Take 0.5 tablets (300 mg total) by mouth in the morning AND 1.5 tablets (900 mg total) at bedtime. 135 tablet 1   rosuvastatin (CRESTOR) 5 MG tablet Take by mouth.     tirzepatide (MOUNJARO) 5 MG/0.5ML Pen SMARTSIG:2.5 Milligram(s) SUB-Q Once a Week     VENTOLIN HFA 108 (90 Base) MCG/ACT inhaler Inhale 2 puffs into the lungs every 6 (six) hours as needed for wheezing or shortness of breath.      hydrOXYzine (VISTARIL) 50 MG capsule Take 1 capsule (50 mg total) by mouth at bedtime. 90 capsule 0   lisdexamfetamine (VYVANSE) 40 MG capsule Take 1 capsule (40 mg total) by mouth every morning. 30 capsule 0   lisdexamfetamine (VYVANSE) 40 MG capsule Take 1 capsule (40 mg total) by mouth every morning. 30 capsule 0   lisdexamfetamine (VYVANSE) 40 MG capsule Take 1 capsule (40 mg total) by mouth every morning. 30 capsule 0   [START ON 06/06/2022] lisdexamfetamine (VYVANSE) 40 MG capsule Take 1 capsule (40 mg total) by mouth every morning. 30 capsule 0   No current facility-administered medications for this visit.    Medication Side Effects: None  Allergies:  Allergies  Allergen Reactions   Prednisone    Clindamycin/Lincomycin Rash    Past Medical History:  Diagnosis Date   Anal fistula    Anxiety    Asthma    Bipolar disorder (HCC)    Chronic back pain    fell in tub few yrs ago   Diabetes mellitus without complication ([place])    type 2   Hemorrhoids    Hypertension    Obesity    Right tennis elbow  01/03/2018   Seasonal allergies    Vitamin D deficiency     Family History  Problem Relation Age of Onset   Depression Mother    Anxiety disorder Father    Anxiety disorder Daughter    Sexual abuse Other    Depression Other    Anxiety disorder Other    Paranoid behavior Maternal Grandmother    Bipolar disorder Cousin    Anxiety disorder Cousin     Social History   Socioeconomic History   Marital status: Married    Spouse name: Brad   Number of children: 3   Years of education: 4   Highest education level: 12th grade  Occupational History   Not on file  Tobacco Use   Smoking status: Former    Packs/day: 0.25    Years: 2.00    Total pack years: 0.50    Types: Cigarettes   Smokeless tobacco: Never   Tobacco comments:    quit sept 2018  Vaping Use  Vaping Use: Never used  Substance and Sexual Activity   Alcohol use: No   Drug use: No   Sexual activity: Yes    Partners: Male  Other Topics Concern   Not on file  Social History Narrative   Not on file   Social Determinants of Health   Financial Resource Strain: Not on file  Food Insecurity: Not on file  Transportation Needs: Not on file  Physical Activity: Not on file  Stress: Stress Concern Present (07/11/2018)   [place]    Feeling of Stress : Very much  Social Connections: Not on file  Intimate Partner Violence: Not on file    Past Medical History, Surgical history, Social history, and Family history were reviewed and updated as appropriate.   Please see review of systems for further details on the patient's review from today.   Objective:   Physical Exam:  Pulse 77   Wt 240 lb (108.9 kg)   BMI 33.47 kg/m   Physical Exam Neurological:     Mental Status: She is alert and oriented to person, place, and time.     Cranial Nerves: No dysarthria.  Psychiatric:        Attention and Perception: Attention and perception normal.         Mood and Affect: Mood is anxious and depressed.        Speech: Speech normal.        Behavior: Behavior is cooperative.        Thought Content: Thought content normal. Thought content is not paranoid or delusional. Thought content does not include homicidal or suicidal ideation. Thought content does not include homicidal or suicidal plan.        Cognition and Memory: Cognition and memory normal.        Judgment: Judgment is impulsive.     Comments: Insight intact Dysphoric mood     Lab Review:     Component Value Date/Time   NA 137 06/02/2021 1724   K 3.5 06/02/2021 1724   CL 100 06/02/2021 1724   CO2 27 06/02/2021 1724   GLUCOSE 160 (H) 06/02/2021 1724   BUN 11 06/02/2021 1724   CREATININE 0.64 06/02/2021 1724   CALCIUM 9.3 06/02/2021 1724   PROT 7.7 06/02/2021 1724   ALBUMIN 3.6 06/02/2021 1724   AST 16 06/02/2021 1724   ALT 17 06/02/2021 1724   ALKPHOS 57 06/02/2021 1724   BILITOT 0.5 06/02/2021 1724   GFRNONAA >60 06/02/2021 1724   GFRAA >60 05/24/2020 0836       Component Value Date/Time   WBC 5.8 06/02/2021 1724   RBC 5.24 (H) 06/02/2021 1724   HGB 15.5 (H) 06/02/2021 1724   HCT 46.0 06/02/2021 1724   PLT 256 06/02/2021 1724   MCV 87.8 06/02/2021 1724   MCH 29.6 06/02/2021 1724   MCHC 33.7 06/02/2021 1724   RDW 12.8 06/02/2021 1724   LYMPHSABS 1.6 06/02/2021 1724   MONOABS 0.4 06/02/2021 1724   EOSABS 0.1 06/02/2021 1724   BASOSABS 0.0 06/02/2021 1724    No results found for: "POCLITH", "LITHIUM"   No results found for: "PHENYTOIN", "PHENOBARB", "VALPROATE", "CBMZ"   .res Assessment: Plan:    Patient seen for 40 minutes and time spent discussing that she is describing signs and symptoms consistent with a mixed episode since she is having some depressive symptoms, irritability and some signs and symptoms of hypomania.  Discussed treatment options for symptoms.  Reviewed that she  had cognitive side effects with higher doses of lamotrigine in the past.   Discussed potential benefits, risks, and side effects of Latuda.  Reviewed potential metabolic side effects associated with atypical antipsychotics, as well as potential risk for movement side effects. Advised pt to contact office if movement side effects occur.  Patient agrees to trial of Latuda.  Will start Latuda 20 mg daily with supper for 1 week, then increase to 40 mg daily for mood symptoms. Discussed that therapy may be beneficial and patient is in agreement with resuming therapy.  Provided patient with possible therapy referrals. Will restart hydroxyzine 50 mg at bedtime as needed for insomnia. Continue lamotrigine 150 mg daily for mood signs and symptoms. Continue BuSpar 30 mg twice daily for anxiety. Continue Vyvanse 40 mg daily for ADHD and eating disorder. Continue citalopram 10 mg daily and increasing to 20 mg daily for 7 to 10 days around menses for treatment of PMDD. Encouraged patient to see OB/GYN regarding very heavy menstrual bleeding and discussed that heavy bleeding could possibly cause anemia and further fatigue. Patient to follow-up with this provider in 4 to 5 weeks or sooner if clinically indicated. Patient advised to contact office with any questions, adverse effects, or acute worsening in signs and symptoms.    Khadeja was seen today for anxiety and insomnia.  Diagnoses and all orders for this visit:  Bipolar II disorder (HCC) -     lurasidone (LATUDA) 40 MG TABS tablet; Take 1/2 tablet daily with supper for one week, then increase to 1 tablet daily with supper  Premenstrual dysphoric disorder -     lurasidone (LATUDA) 40 MG TABS tablet; Take 1/2 tablet daily with supper for one week, then increase to 1 tablet daily with supper  Generalized anxiety disorder -     hydrOXYzine (VISTARIL) 50 MG capsule; Take 1 capsule (50 mg total) by mouth at bedtime.  Attention deficit hyperactivity disorder (ADHD), predominantly inattentive type -     lisdexamfetamine (VYVANSE)  40 MG capsule; Take 1 capsule (40 mg total) by mouth every morning.     Please see After Visit Summary for patient specific instructions.  No future appointments.  No orders of the defined types were placed in this encounter.     -------------------------------

## 2022-06-01 ENCOUNTER — Ambulatory Visit: Payer: BC Managed Care – PPO | Admitting: Adult Health

## 2022-07-07 ENCOUNTER — Other Ambulatory Visit: Payer: Self-pay

## 2022-07-07 ENCOUNTER — Telehealth: Payer: Self-pay | Admitting: Psychiatry

## 2022-07-07 DIAGNOSIS — F900 Attention-deficit hyperactivity disorder, predominantly inattentive type: Secondary | ICD-10-CM

## 2022-07-07 DIAGNOSIS — F5081 Binge eating disorder: Secondary | ICD-10-CM

## 2022-07-07 MED ORDER — LISDEXAMFETAMINE DIMESYLATE 40 MG PO CAPS: 40.0000 mg | ORAL_CAPSULE | ORAL | 0 refills | Status: DC

## 2022-07-07 NOTE — Telephone Encounter (Signed)
Pended.

## 2022-07-07 NOTE — Telephone Encounter (Signed)
Pt requesting Rx Vyvanse Tanya Parker. No apt

## 2022-07-20 ENCOUNTER — Other Ambulatory Visit: Payer: Self-pay | Admitting: Psychiatry

## 2022-07-20 DIAGNOSIS — F411 Generalized anxiety disorder: Secondary | ICD-10-CM

## 2022-08-03 ENCOUNTER — Telehealth: Payer: Self-pay | Admitting: Psychiatry

## 2022-08-03 DIAGNOSIS — F411 Generalized anxiety disorder: Secondary | ICD-10-CM

## 2022-08-03 NOTE — Telephone Encounter (Signed)
Pt requesting Rx for Generic Vyvanse 40 mg and Hydroxyzine to [facility]  APT 11/7

## 2022-08-04 ENCOUNTER — Other Ambulatory Visit: Payer: Self-pay

## 2022-08-04 DIAGNOSIS — F900 Attention-deficit hyperactivity disorder, predominantly inattentive type: Secondary | ICD-10-CM

## 2022-08-04 MED ORDER — LISDEXAMFETAMINE DIMESYLATE 40 MG PO CAPS: 40.0000 mg | ORAL_CAPSULE | ORAL | 0 refills | Status: DC

## 2022-08-04 MED ORDER — HYDROXYZINE PAMOATE 50 MG PO CAPS: 50.0000 mg | ORAL_CAPSULE | Freq: Every day | ORAL | 0 refills | Status: DC

## 2022-08-04 NOTE — Telephone Encounter (Signed)
Pended.

## 2022-08-15 ENCOUNTER — Encounter: Payer: Self-pay | Admitting: Psychiatry

## 2022-08-15 ENCOUNTER — Telehealth (INDEPENDENT_AMBULATORY_CARE_PROVIDER_SITE_OTHER): Payer: BC Managed Care – PPO | Admitting: Psychiatry

## 2022-08-15 DIAGNOSIS — F3281 Premenstrual dysphoric disorder: Secondary | ICD-10-CM

## 2022-08-15 DIAGNOSIS — F3181 Bipolar II disorder: Secondary | ICD-10-CM | POA: Diagnosis not present

## 2022-08-15 DIAGNOSIS — F411 Generalized anxiety disorder: Secondary | ICD-10-CM

## 2022-08-15 MED ORDER — LURASIDONE HCL 40 MG PO TABS: 40.0000 mg | ORAL_TABLET | Freq: Every day | ORAL | 1 refills | Status: DC

## 2022-08-15 MED ORDER — BUSPIRONE HCL 30 MG PO TABS: 30.0000 mg | ORAL_TABLET | Freq: Two times a day (BID) | ORAL | 0 refills | Status: DC

## 2022-08-15 MED ORDER — OXCARBAZEPINE 600 MG PO TABS: ORAL_TABLET | ORAL | 1 refills | Status: DC

## 2022-08-15 MED ORDER — HYDROXYZINE PAMOATE 50 MG PO CAPS: 50.0000 mg | ORAL_CAPSULE | Freq: Every day | ORAL | 0 refills | Status: DC

## 2022-08-15 MED ORDER — CITALOPRAM HYDROBROMIDE 10 MG PO TABS: ORAL_TABLET | ORAL | 1 refills | Status: DC

## 2022-08-15 MED ORDER — LAMOTRIGINE 150 MG PO TABS: 150.0000 mg | ORAL_TABLET | Freq: Every day | ORAL | 1 refills | Status: DC

## 2022-08-15 NOTE — Progress Notes (Signed)
Tanya Parker [id number] 07-29-1976 46 y.o.  Virtual Visit via Video Note  I connected with pt @ on 08/15/22 at 10:00 AM EST by a video enabled telemedicine application and verified that I am speaking with the correct person using two identifiers.   I discussed the limitations of evaluation and management by telemedicine and the availability of in person appointments. The patient expressed understanding and agreed to proceed.  I discussed the assessment and treatment plan with the patient. The patient was provided an opportunity to ask questions and all were answered. The patient agreed with the plan and demonstrated an understanding of the instructions.   The patient was advised to call back or seek an in-person evaluation if the symptoms worsen or if the condition fails to improve as anticipated.  I provided 30 minutes of non-face-to-face time during this encounter.  The patient was located in her personal vehicle in [place].  The provider was located at [place].   Thayer Headings, PMHNP   Subjective:   Patient ID:  Tanya Parker is a 46 y.o. (DOB January 01, 1976) female.  Chief Complaint:  Chief Complaint  Patient presents with   Follow-up    Bipolar Disorder, Anxiety, insomnia   Other    Binge Eating    HPI [name] presents for follow-up of mood disturbance, anxiety, and insomnia. She reports, "I'm doing ok." She reports that [place] has been helpful for her sleep and mood. She reports sleeping 4-5 hours a night. She reports that her irritability has been significantly less. She reports the season change "is affecting me, but not like it has in the past" with some mild depression. She reports that depression is less than she would expect considering grief. Energy and motivation have been ok.    She reports that she is continuing to "come to terms with everything." She has been more active, to include taking walks and bought a bicycle. She reports physical  activity has been helpful for her mood. She reports gaining 8 lbs since starting Latuda. She has been wanting to eat at night again. Denies binge eating. She reports that concentration is fair and that it can take her some time to process new information. She reports that her current job is conducive to ADHD. Denies excessive spending or manic s/s. She reports that she will put items in her cart and then will put items back after she thinks about some items. Denies SI.   She has been decorating for Christmas. Denies mania- "I just really love Christmas." She plans to go to her mother's house in [place] for Christmas.   She is on a waiting list to see someone locally for EMDR.   Son moved out of mother's house. She reports that she and her son are "still not talking."   She is enjoying her job. She is being recognized for outstanding performance.   Vyvanse last filled 08/07/22.  Past medication trials:  Citalopram Trileptal Lamictal-helpful for mood Remeron-effective but caused weight gain Seroquel-excessive somnolence Rexulti-increase glucose, helpful for mood and anxiety Xanax Ambien Trazodone Concerta-helpful Adderall-increased heart rate Ritalin Hydroxyzine BuSpar-effective for anxiety  Review of Systems:  Review of Systems  Endocrine:       Reports good glycemic control  Musculoskeletal:  Negative for gait problem.  Neurological:  Negative for tremors.  Psychiatric/Behavioral:         Please refer to HPI    Medications: I have reviewed the patient's current medications.  Current Outpatient Medications  Medication  Sig Dispense Refill   busPIRone (BUSPAR) 30 MG tablet Take 1 tablet (30 mg total) by mouth 2 (two) times daily. 180 tablet 0   citalopram (CELEXA) 10 MG tablet Take 10 mg by mouth daily and then increase to 20 mg daily for one week before menstrual period starts and then go back to 10 mg daily 135 tablet 1   hydrOXYzine (VISTARIL) 50 MG capsule Take 1  capsule (50 mg total) by mouth at bedtime. 90 capsule 0   lamoTRIgine (LAMICTAL) 150 MG tablet Take 1 tablet (150 mg total) by mouth daily. 90 tablet 1   lisdexamfetamine (VYVANSE) 40 MG capsule Take 1 capsule (40 mg total) by mouth every morning. 30 capsule 0   lisdexamfetamine (VYVANSE) 40 MG capsule Take 1 capsule (40 mg total) by mouth every morning. 30 capsule 0   lisdexamfetamine (VYVANSE) 40 MG capsule Take 1 capsule (40 mg total) by mouth every morning. 30 capsule 0   lisdexamfetamine (VYVANSE) 40 MG capsule Take 1 capsule (40 mg total) by mouth every morning. 30 capsule 0   lisinopril (ZESTRIL) 40 MG tablet Take 40 mg by mouth daily.     lurasidone (LATUDA) 40 MG TABS tablet Take 1 tablet (40 mg total) by mouth daily with supper. 90 tablet 1   metFORMIN (GLUCOPHAGE) 1000 MG tablet Take 1,000 mg by mouth 2 (two) times daily.     oxcarbazepine (TRILEPTAL) 600 MG tablet Take 0.5 tablets (300 mg total) by mouth in the morning AND 1.5 tablets (900 mg total) at bedtime. 135 tablet 1   rosuvastatin (CRESTOR) 5 MG tablet Take by mouth.     tirzepatide (MOUNJARO) 5 MG/0.5ML Pen SMARTSIG:2.5 Milligram(s) SUB-Q Once a Week     VENTOLIN HFA 108 (90 Base) MCG/ACT inhaler Inhale 2 puffs into the lungs every 6 (six) hours as needed for wheezing or shortness of breath.      No current facility-administered medications for this visit.    Medication Side Effects: None  Allergies:  Allergies  Allergen Reactions   Prednisone    Clindamycin/Lincomycin Rash    Past Medical History:  Diagnosis Date   Anal fistula    Anxiety    Asthma    Bipolar disorder (HCC)    Chronic back pain    fell in tub few yrs ago   Diabetes mellitus without complication (HCC)    type 2   Hemorrhoids    Hypertension    Obesity    Right tennis elbow 01/03/2018   Seasonal allergies    Vitamin D deficiency     Family History  Problem Relation Age of Onset   Depression Mother    Anxiety disorder Father     Anxiety disorder Daughter    Sexual abuse Other    Depression Other    Anxiety disorder Other    Paranoid behavior Maternal Grandmother    Bipolar disorder Cousin    Anxiety disorder Cousin     Social History   Socioeconomic History   Marital status: Married    Spouse name: Brad   Number of children: 3   Years of education: 4   Highest education level: 12th grade  Occupational History   Not on file  Tobacco Use   Smoking status: Former    Packs/day: 0.25    Years: 2.00    Total pack years: 0.50    Types: Cigarettes   Smokeless tobacco: Never   Tobacco comments:    quit sept 2018  Vaping Use  Vaping Use: Never used  Substance and Sexual Activity   Alcohol use: No   Drug use: No   Sexual activity: Yes    Partners: Male  Other Topics Concern   Not on file  Social History Narrative   Not on file   Social Determinants of Health   Financial Resource Strain: Not on file  Food Insecurity: Not on file  Transportation Needs: Not on file  Physical Activity: Not on file  Stress: Stress Concern Present (07/11/2018)   [employer] of Occupational Health - Occupational Stress Questionnaire    Feeling of Stress : Very much  Social Connections: Not on file  Intimate Partner Violence: Not on file    Past Medical History, Surgical history, Social history, and Family history were reviewed and updated as appropriate.   Please see review of systems for further details on the patient's review from today.   Objective:   Physical Exam:  There were no vitals taken for this visit.  Physical Exam Neurological:     Mental Status: She is alert and oriented to person, place, and time.     Cranial Nerves: No dysarthria.  Psychiatric:        Attention and Perception: Attention and perception normal.        Mood and Affect: Mood normal.        Speech: Speech normal.        Behavior: Behavior is cooperative.        Thought Content: Thought content normal. Thought content is  not paranoid or delusional. Thought content does not include homicidal or suicidal ideation. Thought content does not include homicidal or suicidal plan.        Cognition and Memory: Cognition and memory normal.        Judgment: Judgment normal.     Comments: Insight intact     Lab Review:     Component Value Date/Time   NA 137 06/02/2021 1724   K 3.5 06/02/2021 1724   CL 100 06/02/2021 1724   CO2 27 06/02/2021 1724   GLUCOSE 160 (H) 06/02/2021 1724   BUN 11 06/02/2021 1724   CREATININE 0.64 06/02/2021 1724   CALCIUM 9.3 06/02/2021 1724   PROT 7.7 06/02/2021 1724   ALBUMIN 3.6 06/02/2021 1724   AST 16 06/02/2021 1724   ALT 17 06/02/2021 1724   ALKPHOS 57 06/02/2021 1724   BILITOT 0.5 06/02/2021 1724   GFRNONAA >60 06/02/2021 1724   GFRAA >60 05/24/2020 0836       Component Value Date/Time   WBC 5.8 06/02/2021 1724   RBC 5.24 (H) 06/02/2021 1724   HGB 15.5 (H) 06/02/2021 1724   HCT 46.0 06/02/2021 1724   PLT 256 06/02/2021 1724   MCV 87.8 06/02/2021 1724   MCH 29.6 06/02/2021 1724   MCHC 33.7 06/02/2021 1724   RDW 12.8 06/02/2021 1724   LYMPHSABS 1.6 06/02/2021 1724   MONOABS 0.4 06/02/2021 1724   EOSABS 0.1 06/02/2021 1724   BASOSABS 0.0 06/02/2021 1724    No results found for: "POCLITH", "LITHIUM"   No results found for: "PHENYTOIN", "PHENOBARB", "VALPROATE", "CBMZ"   .res Assessment: Plan:    Pt seen for 25 minutes and time spent discussing response to treatment. She asks about possible increase in Vyvanse to further improve impulsive eating and craving sweets. Recommended that she check her vital signs and contact clinic after obtaining readings before increasing Vyvanse to ensure tolerability since pt has not had a recent BP reading. Pt agrees to check  BP and pulse once she is home and will contact office.  Continue Latuda 40 mg daily for mood symptoms. Continue hydroxyzine 50 mg at bedtime as needed for insomnia. Continue lamotrigine 150 mg daily for mood  signs and symptoms. Continue BuSpar 30 mg twice daily for anxiety. Continue Vyvanse 40 mg daily for ADHD and eating disorder. Continue citalopram 10 mg daily and increasing to 20 mg daily for 7 to 10 days around menses for treatment of PMDD. Pt to follow-up in 3 months or sooner if clinically indicated.  Patient advised to contact office with any questions, adverse effects, or acute worsening in signs and symptoms.  Donja was seen today for follow-up and other.  Diagnoses and all orders for this visit:  Generalized anxiety disorder -     citalopram (CELEXA) 10 MG tablet; Take 10 mg by mouth daily and then increase to 20 mg daily for one week before menstrual period starts and then go back to 10 mg daily -     hydrOXYzine (VISTARIL) 50 MG capsule; Take 1 capsule (50 mg total) by mouth at bedtime. -     busPIRone (BUSPAR) 30 MG tablet; Take 1 tablet (30 mg total) by mouth 2 (two) times daily.  Bipolar II disorder (HCC) -     citalopram (CELEXA) 10 MG tablet; Take 10 mg by mouth daily and then increase to 20 mg daily for one week before menstrual period starts and then go back to 10 mg daily -     lamoTRIgine (LAMICTAL) 150 MG tablet; Take 1 tablet (150 mg total) by mouth daily. -     lurasidone (LATUDA) 40 MG TABS tablet; Take 1 tablet (40 mg total) by mouth daily with supper. -     oxcarbazepine (TRILEPTAL) 600 MG tablet; Take 0.5 tablets (300 mg total) by mouth in the morning AND 1.5 tablets (900 mg total) at bedtime.  Premenstrual dysphoric disorder -     citalopram (CELEXA) 10 MG tablet; Take 10 mg by mouth daily and then increase to 20 mg daily for one week before menstrual period starts and then go back to 10 mg daily -     lurasidone (LATUDA) 40 MG TABS tablet; Take 1 tablet (40 mg total) by mouth daily with supper.     Please see After Visit Summary for patient specific instructions.  No future appointments.  No orders of the defined types were placed in this encounter.      -------------------------------

## 2022-09-04 ENCOUNTER — Other Ambulatory Visit: Payer: Self-pay

## 2022-09-04 ENCOUNTER — Telehealth: Payer: Self-pay | Admitting: Psychiatry

## 2022-09-04 DIAGNOSIS — F900 Attention-deficit hyperactivity disorder, predominantly inattentive type: Secondary | ICD-10-CM

## 2022-09-04 DIAGNOSIS — F5081 Binge eating disorder: Secondary | ICD-10-CM

## 2022-09-04 NOTE — Telephone Encounter (Signed)
Pended.

## 2022-09-04 NOTE — Telephone Encounter (Signed)
Please send in RF Vyvanse 40mg  for pt to: [facility] [address], [address] - [address]  [address], [place]  Called and LM for pt to schedule appt

## 2022-09-04 NOTE — Telephone Encounter (Signed)
Plan was for pt to check BP and call back with info. Discussed possibly increasing Vyvanse dose, depending on VS. Would you please contact her to ask about most recent BP and HR?

## 2022-09-05 NOTE — Telephone Encounter (Signed)
Pt called and  said that she needs a refill on her vyvanse 40 mg. She has an appt in february

## 2022-09-06 MED ORDER — LISDEXAMFETAMINE DIMESYLATE 40 MG PO CAPS: 40.0000 mg | ORAL_CAPSULE | ORAL | 0 refills | Status: DC

## 2022-09-06 NOTE — Telephone Encounter (Signed)
BP 2 days ago was 150/72, she checked HR with her watch while we were on the phone and it was 82.

## 2022-11-03 ENCOUNTER — Telehealth: Payer: Self-pay | Admitting: Psychiatry

## 2022-11-03 NOTE — Telephone Encounter (Signed)
Pt LVM@ 1:15p.  She said she has new insurance and has to use [employer]. She said [facility] is going to send a PA for her to get the generic or regular Vyvanse.  Next appt 2/28

## 2022-11-04 ENCOUNTER — Telehealth: Payer: Self-pay

## 2022-11-04 NOTE — Telephone Encounter (Signed)
PA approved.  10/05/2022;Coverage End Date:11/04/2023

## 2022-11-04 NOTE — Telephone Encounter (Signed)
Prior Authorization Lisdexamfetamine Dimesylate 40MG  capsules #30 [facility]  This request has been approved using information available on the patient's profile. [id number]:[id number];Review Type:Prior Auth;Coverage Start Date:10/05/2022;Coverage End Date:11/04/2023

## 2022-11-29 ENCOUNTER — Ambulatory Visit (INDEPENDENT_AMBULATORY_CARE_PROVIDER_SITE_OTHER): Payer: 59 | Admitting: Psychiatry

## 2022-11-29 ENCOUNTER — Encounter: Payer: Self-pay | Admitting: Psychiatry

## 2022-11-29 DIAGNOSIS — F5081 Binge eating disorder: Secondary | ICD-10-CM | POA: Diagnosis not present

## 2022-11-29 DIAGNOSIS — F900 Attention-deficit hyperactivity disorder, predominantly inattentive type: Secondary | ICD-10-CM

## 2022-11-29 DIAGNOSIS — F3181 Bipolar II disorder: Secondary | ICD-10-CM

## 2022-11-29 DIAGNOSIS — F411 Generalized anxiety disorder: Secondary | ICD-10-CM

## 2022-11-29 DIAGNOSIS — F3281 Premenstrual dysphoric disorder: Secondary | ICD-10-CM

## 2022-11-29 MED ORDER — OXCARBAZEPINE 600 MG PO TABS: ORAL_TABLET | ORAL | 1 refills | Status: DC

## 2022-11-29 MED ORDER — LAMOTRIGINE 150 MG PO TABS: 150.0000 mg | ORAL_TABLET | Freq: Every day | ORAL | 1 refills | Status: DC

## 2022-11-29 MED ORDER — LISDEXAMFETAMINE DIMESYLATE 40 MG PO CAPS: 40.0000 mg | ORAL_CAPSULE | ORAL | 0 refills | Status: DC

## 2022-11-29 MED ORDER — CITALOPRAM HYDROBROMIDE 10 MG PO TABS: ORAL_TABLET | ORAL | 1 refills | Status: DC

## 2022-11-29 MED ORDER — BUSPIRONE HCL 30 MG PO TABS: 30.0000 mg | ORAL_TABLET | Freq: Two times a day (BID) | ORAL | 1 refills | Status: DC

## 2022-11-29 MED ORDER — HYDROXYZINE PAMOATE 50 MG PO CAPS: 50.0000 mg | ORAL_CAPSULE | Freq: Every day | ORAL | 1 refills | Status: AC

## 2022-11-29 MED ORDER — LURASIDONE HCL 60 MG PO TABS: 60.0000 mg | ORAL_TABLET | Freq: Every day | ORAL | 1 refills | Status: DC

## 2022-11-29 NOTE — Progress Notes (Signed)
Tanya Parker:[id number] 07-Sep-1976 47 y.o.  Subjective:   Patient ID:  Tanya Parker is a 47 y.o. (DOB 23-Nov-1975) female.  Chief Complaint:  Chief Complaint  Patient presents with   Depression   Anxiety   ADHD    HPI Tanya Parker presents to the office today for follow-up of Bipolar Disorder, anxiety, insomnia, and ADHD. She reports, "I think I have entered peri-menopause." She has noticed hot sweats, flushing, missed periods. "The depression has plummeted and the anxiety is through the roof." She reports that her energy and motivation have been really low. She reports that these symptoms have been worse off Vyvanse. She has not been able to get Vyvanse due to insurance not covering it until she is seen in person. She has had 2 panic attacks. She reports that she will hold onto something when panic occurs.   She reports that grief related to her dad's death "is getting better." She reports poor sleep. Denies manic symptoms to include impulsive or risky behavior. Occ fleeting passive death wishes. Denies SI.   She reports gaining 20 lbs after losing weight. She has had binge eating without Vyvanse.   She reports that she is in active recovery. Significant other has been dealing with some substance misuse and this has increased her anxiety.   Has a new cat.   She reports that work is going well.   Vyvanse last filled 10/06/22.  On waiting list for EMDR.  Past medication trials:  Citalopram Trileptal Lamictal-helpful for mood Remeron-effective but caused weight gain Seroquel-excessive somnolence Rexulti-increase glucose, helpful for mood and anxiety Xanax Ambien Trazodone Concerta-helpful Adderall-increased heart rate Ritalin Hydroxyzine BuSpar-effective for anxiety   AIMS    Flowsheet Row Office Visit from 11/29/2022 in [place] Video Visit from 08/15/2022 in [place] Total Score 0 0       Flowsheet Row ED from 06/02/2021 in [facility] at [place] No Risk        Review of Systems:  Review of Systems  Cardiovascular:  Negative for palpitations.  Musculoskeletal:  Negative for gait problem.  Skin:        Itchy skin  Neurological:  Negative for tremors.  Psychiatric/Behavioral:         Please refer to HPI  Will send endocrinologist.   Medications: I have reviewed the patient's current medications.  Current Outpatient Medications  Medication Sig Dispense Refill   lisinopril (ZESTRIL) 40 MG tablet Take 40 mg by mouth daily.     metFORMIN (GLUCOPHAGE) 1000 MG tablet Take 1,000 mg by mouth 2 (two) times daily.     rosuvastatin (CRESTOR) 5 MG tablet Take by mouth.     VENTOLIN HFA 108 (90 Base) MCG/ACT inhaler Inhale 2 puffs into the lungs every 6 (six) hours as needed for wheezing or shortness of breath.      busPIRone (BUSPAR) 30 MG tablet Take 1 tablet (30 mg total) by mouth 2 (two) times daily. 180 tablet 1   citalopram (CELEXA) 10 MG tablet Take 10 mg by mouth daily and then increase to 20 mg daily for one week before menstrual period starts and then go back to 10 mg daily 135 tablet 1   hydrOXYzine (VISTARIL) 50 MG capsule Take 1 capsule (50 mg total) by mouth at bedtime. 90 capsule 1   lamoTRIgine (LAMICTAL) 150 MG tablet Take 1 tablet (150 mg total) by mouth daily.  90 tablet 1   lisdexamfetamine (VYVANSE) 40 MG capsule Take 1 capsule (40 mg total) by mouth every morning. 30 capsule 0   lisdexamfetamine (VYVANSE) 40 MG capsule Take 1 capsule (40 mg total) by mouth every morning. 30 capsule 0   [START ON 01/24/2023] lisdexamfetamine (VYVANSE) 40 MG capsule Take 1 capsule (40 mg total) by mouth every morning. 30 capsule 0   [START ON 12/27/2022] lisdexamfetamine (VYVANSE) 40 MG capsule Take 1 capsule (40 mg total) by mouth every morning. 30 capsule 0   Lurasidone HCl (LATUDA) 60 MG TABS Take 1 tablet (60 mg  total) by mouth daily with supper. 30 tablet 1   oxcarbazepine (TRILEPTAL) 600 MG tablet Take 0.5 tablets (300 mg total) by mouth in the morning AND 1.5 tablets (900 mg total) at bedtime. 135 tablet 1   tirzepatide [facility]) 5 MG/0.5ML Pen SMARTSIG:2.5 Milligram(s) SUB-Q Once a Week (Patient not taking: Reported on 11/29/2022)     No current facility-administered medications for this visit.    Medication Side Effects: None  Allergies:  Allergies  Allergen Reactions   Prednisone    Clindamycin/Lincomycin Rash    Past Medical History:  Diagnosis Date   Anal fistula    Anxiety    Asthma    Bipolar disorder (HCC)    Chronic back pain    fell in tub few yrs ago   Diabetes mellitus without complication ([place])    type 2   Hemorrhoids    Hypertension    Obesity    Right tennis elbow 01/03/2018   Seasonal allergies    Vitamin D deficiency     Past Medical History, Surgical history, Social history, and Family history were reviewed and updated as appropriate.   Please see review of systems for further details on the patient's review from today.   Objective:   Physical Exam:  BP (!) 141/81   Pulse 76   Wt 257 lb (116.6 kg)   BMI 35.84 kg/m   Physical Exam Constitutional:      General: She is not in acute distress. Musculoskeletal:        General: No deformity.  Neurological:     Mental Status: She is alert and oriented to person, place, and time.     Coordination: Coordination normal.  Psychiatric:        Attention and Perception: Attention and perception normal. She does not perceive auditory or visual hallucinations.        Mood and Affect: Mood is anxious and depressed. Affect is not labile, blunt, angry or inappropriate.        Speech: Speech normal.        Behavior: Behavior normal.        Thought Content: Thought content normal. Thought content is not paranoid or delusional. Thought content does not include homicidal or suicidal ideation. Thought content does not  include homicidal or suicidal plan.        Cognition and Memory: Cognition and memory normal.        Judgment: Judgment normal.     Comments: Insight intact     Lab Review:     Component Value Date/Time   NA 137 06/02/2021 1724   K 3.5 06/02/2021 1724   CL 100 06/02/2021 1724   CO2 27 06/02/2021 1724   GLUCOSE 160 (H) 06/02/2021 1724   BUN 11 06/02/2021 1724   CREATININE 0.64 06/02/2021 1724   CALCIUM 9.3 06/02/2021 1724   PROT 7.7 06/02/2021 1724   ALBUMIN 3.6 06/02/2021  1724   AST 16 06/02/2021 1724   ALT 17 06/02/2021 1724   ALKPHOS 57 06/02/2021 1724   BILITOT 0.5 06/02/2021 1724   GFRNONAA >60 06/02/2021 1724   GFRAA >60 05/24/2020 0836       Component Value Date/Time   WBC 5.8 06/02/2021 1724   RBC 5.24 (H) 06/02/2021 1724   HGB 15.5 (H) 06/02/2021 1724   HCT 46.0 06/02/2021 1724   PLT 256 06/02/2021 1724   MCV 87.8 06/02/2021 1724   MCH 29.6 06/02/2021 1724   MCHC 33.7 06/02/2021 1724   RDW 12.8 06/02/2021 1724   LYMPHSABS 1.6 06/02/2021 1724   MONOABS 0.4 06/02/2021 1724   EOSABS 0.1 06/02/2021 1724   BASOSABS 0.0 06/02/2021 1724    No results found for: "POCLITH", "LITHIUM"   No results found for: "PHENYTOIN", "PHENOBARB", "VALPROATE", "CBMZ"   .res Assessment: Plan:    Discussed potential benefits, risks, and side effects of increasing Latuda to 60 mg po qd to stabilize mood, and possibly improve sleep and anxiety as well. Pt agrees to increase in [place].  Recommend re-starting Vyvanse 40 mg po qd for treatment of ADHD and binge eating disorder.  Continue Citalopram 10 mg po qd and increase to 20 mg po qd one week before start of menses for PMDD and anxiety.  Continue Hydroxyzine 50 mg po QHS for insomnia.  Continue Lamictal 150 mg po qd for mood stabilization.  Continue Trileptal 300 mg po q am and 900 mg po QHS for mood stabilization.  Pt to follow-up with this provider in 4 weeks or sooner if clinically indicated.  Patient advised to contact  office with any questions, adverse effects, or acute worsening in signs and symptoms.   Tanya Parker was seen today for depression, anxiety and adhd.  Diagnoses and all orders for this visit:  Bipolar II disorder ([place]) -     Lurasidone HCl (LATUDA) 60 MG TABS; Take 1 tablet (60 mg total) by mouth daily with supper. -     citalopram (CELEXA) 10 MG tablet; Take 10 mg by mouth daily and then increase to 20 mg daily for one week before menstrual period starts and then go back to 10 mg daily -     lamoTRIgine (LAMICTAL) 150 MG tablet; Take 1 tablet (150 mg total) by mouth daily. -     oxcarbazepine (TRILEPTAL) 600 MG tablet; Take 0.5 tablets (300 mg total) by mouth in the morning AND 1.5 tablets (900 mg total) at bedtime.  Premenstrual dysphoric disorder -     Lurasidone HCl (LATUDA) 60 MG TABS; Take 1 tablet (60 mg total) by mouth daily with supper. -     citalopram (CELEXA) 10 MG tablet; Take 10 mg by mouth daily and then increase to 20 mg daily for one week before menstrual period starts and then go back to 10 mg daily  Generalized anxiety disorder -     busPIRone (BUSPAR) 30 MG tablet; Take 1 tablet (30 mg total) by mouth 2 (two) times daily. -     citalopram (CELEXA) 10 MG tablet; Take 10 mg by mouth daily and then increase to 20 mg daily for one week before menstrual period starts and then go back to 10 mg daily -     hydrOXYzine (VISTARIL) 50 MG capsule; Take 1 capsule (50 mg total) by mouth at bedtime.  Attention deficit hyperactivity disorder (ADHD), predominantly inattentive type -     lisdexamfetamine (VYVANSE) 40 MG capsule; Take 1 capsule (40 mg total) by  mouth every morning. -     lisdexamfetamine (VYVANSE) 40 MG capsule; Take 1 capsule (40 mg total) by mouth every morning.  Binge eating disorder     Please see After Visit Summary for patient specific instructions.  Future Appointments  Date Time Provider [place]  12/06/2022  9:00 AM Thayer Headings, PMHNP CP-CP None     No orders of the defined types were placed in this encounter.   -------------------------------

## 2022-12-06 ENCOUNTER — Telehealth: Payer: BC Managed Care – PPO | Admitting: Psychiatry

## 2022-12-25 ENCOUNTER — Telehealth: Payer: Self-pay | Admitting: Psychiatry

## 2022-12-25 NOTE — Telephone Encounter (Signed)
Authorized fill today.

## 2022-12-25 NOTE — Telephone Encounter (Signed)
Pharmacy reporting last Rx was filled #25 of the Vyvanse 40 mg. Out over weekend. Next Rx due 3/20 for fill. Requesting early refill. Please advise Pharmacy [phone]. Apt 4/2

## 2022-12-25 NOTE — Telephone Encounter (Signed)
Please call pharmacy to authorize early refill.

## 2023-01-19 ENCOUNTER — Encounter: Payer: Self-pay | Admitting: Psychiatry

## 2023-01-19 ENCOUNTER — Telehealth (INDEPENDENT_AMBULATORY_CARE_PROVIDER_SITE_OTHER): Payer: 59 | Admitting: Psychiatry

## 2023-01-19 DIAGNOSIS — F411 Generalized anxiety disorder: Secondary | ICD-10-CM | POA: Diagnosis not present

## 2023-01-19 DIAGNOSIS — F3281 Premenstrual dysphoric disorder: Secondary | ICD-10-CM

## 2023-01-19 DIAGNOSIS — F5081 Binge eating disorder: Secondary | ICD-10-CM

## 2023-01-19 DIAGNOSIS — F3181 Bipolar II disorder: Secondary | ICD-10-CM | POA: Diagnosis not present

## 2023-01-19 DIAGNOSIS — F900 Attention-deficit hyperactivity disorder, predominantly inattentive type: Secondary | ICD-10-CM | POA: Diagnosis not present

## 2023-01-19 MED ORDER — LAMOTRIGINE 150 MG PO TABS: 150.0000 mg | ORAL_TABLET | Freq: Every day | ORAL | 1 refills | Status: DC

## 2023-01-19 MED ORDER — LISDEXAMFETAMINE DIMESYLATE 40 MG PO CAPS: 40.0000 mg | ORAL_CAPSULE | ORAL | 0 refills | Status: DC

## 2023-01-19 MED ORDER — LURASIDONE HCL 40 MG PO TABS: 40.0000 mg | ORAL_TABLET | Freq: Every day | ORAL | 1 refills | Status: DC

## 2023-01-19 MED ORDER — CITALOPRAM HYDROBROMIDE 10 MG PO TABS: ORAL_TABLET | ORAL | 1 refills | Status: DC

## 2023-01-19 MED ORDER — BUSPIRONE HCL 30 MG PO TABS: 30.0000 mg | ORAL_TABLET | Freq: Two times a day (BID) | ORAL | 1 refills | Status: DC

## 2023-01-19 NOTE — Progress Notes (Signed)
Tanya Parker [id number] 05-31-1976 47 y.o.  Virtual Visit via Video Note  I connected with pt @ on 01/19/23 at 10:00 AM EDT by a video enabled telemedicine application and verified that I am speaking with the correct person using two identifiers.   I discussed the limitations of evaluation and management by telemedicine and the availability of in person appointments. The patient expressed understanding and agreed to proceed.  I discussed the assessment and treatment plan with the patient. The patient was provided an opportunity to ask questions and all were answered. The patient agreed with the plan and demonstrated an understanding of the instructions.   The patient was advised to call back or seek an in-person evaluation if the symptoms worsen or if the condition fails to improve as anticipated.  I provided 30 minutes of non-face-to-face time during this encounter.  The patient was located in her personal vehicle in [place], [place].  The provider was located at [facility].   Corie Chiquito, PMHNP   Subjective:   Patient ID:  Tanya Parker is a 47 y.o. (DOB [birth year]) female.  Chief Complaint:  Chief Complaint  Patient presents with   Follow-up    Anxiety, Bipolar Disorder, ADHD, and insomnia    HPI Tanya Parker presents for follow-up of Bipolar D/O, Anxiety, ADHD, and insomnia. She had Bell's Palsy about 2 weeks ago. She reports that this was challenging and that others were more supportive. She took some days off after this. She reports that her mood has been disrupted over the last few weeks with illness and Prednisone. She reports that she has been sleeping in another room due to partner's snoring. She reports that her sleep has improved. She reports that she did not increase Latuda due to fear of running out of medication. Mood has been slightly labile and has been stabilizing recently after completing prednisone and Bell's Palsy.Mood has been slightly  lower in response to medical issues and change in body image. Anxiety has been consistent with baseline and has manageable. Energy and motivation have been lower and attributes this to weight gain. Concentration has been "ok, about the same." She reports that she has to think and formulate her thoughts before speaking. Appetite has been increased. She reports that Vyvanse has helped with binge eating during the day. Has been waking up and eating at night. Denies SI.   Not taking Hydroxyzine nightly.   Denies any substance use cravings.   Reports 30 lb weight gain since being off Mounjaro. She saw endocrinologist yesterday and plans to restart [facility].   Daughter and her baby came to visit. Reports home life has been better.  Vyvanse last filled 12/27/22  Past medication trials:  Citalopram Trileptal Lamictal-helpful for mood Remeron-effective but caused weight gain Seroquel-excessive somnolence Rexulti-increase glucose, helpful for mood and anxiety Xanax Ambien Trazodone Concerta-helpful Adderall-increased heart rate Ritalin Hydroxyzine BuSpar-effective for anxiety  Review of Systems:  Review of Systems  Genitourinary:        She reports that her menstrual period was 2 weeks late  Musculoskeletal:  Negative for gait problem.  Neurological:  Negative for tremors.       Residual Bell's Palsy  Psychiatric/Behavioral:         Please refer to HPI    Medications: I have reviewed the patient's current medications.  Current Outpatient Medications  Medication Sig Dispense Refill   lisinopril (ZESTRIL) 40 MG tablet Take 40 mg by mouth daily.     metFORMIN (GLUCOPHAGE) 1000 MG  tablet Take 1,000 mg by mouth 2 (two) times daily.     oxcarbazepine (TRILEPTAL) 600 MG tablet Take 0.5 tablets (300 mg total) by mouth in the morning AND 1.5 tablets (900 mg total) at bedtime. 135 tablet 1   rosuvastatin (CRESTOR) 5 MG tablet Take by mouth.     VENTOLIN HFA 108 (90 Base) MCG/ACT inhaler  Inhale 2 puffs into the lungs every 6 (six) hours as needed for wheezing or shortness of breath.      busPIRone (BUSPAR) 30 MG tablet Take 1 tablet (30 mg total) by mouth 2 (two) times daily. 180 tablet 1   citalopram (CELEXA) 10 MG tablet Take 10 mg by mouth daily and then increase to 20 mg daily for one week before menstrual period starts and then go back to 10 mg daily 135 tablet 1   hydrOXYzine (VISTARIL) 50 MG capsule Take 1 capsule (50 mg total) by mouth at bedtime. (Patient not taking: Reported on 01/19/2023) 90 capsule 1   lamoTRIgine (LAMICTAL) 150 MG tablet Take 1 tablet (150 mg total) by mouth daily. 90 tablet 1   [START ON 01/24/2023] lisdexamfetamine (VYVANSE) 40 MG capsule Take 1 capsule (40 mg total) by mouth every morning. 30 capsule 0   lisdexamfetamine (VYVANSE) 40 MG capsule Take 1 capsule (40 mg total) by mouth every morning. 30 capsule 0   [START ON 02/21/2023] lisdexamfetamine (VYVANSE) 40 MG capsule Take 1 capsule (40 mg total) by mouth every morning. 30 capsule 0   [START ON 03/21/2023] lisdexamfetamine (VYVANSE) 40 MG capsule Take 1 capsule (40 mg total) by mouth every morning. 30 capsule 0   lurasidone (LATUDA) 40 MG TABS tablet Take 1 tablet (40 mg total) by mouth daily with supper. 90 tablet 1   tirzepatide [facility]) 5 MG/0.5ML Pen SMARTSIG:2.5 Milligram(s) SUB-Q Once a Week (Patient not taking: Reported on 11/29/2022)     No current facility-administered medications for this visit.    Medication Side Effects: None  Allergies:  Allergies  Allergen Reactions   Prednisone    Clindamycin/Lincomycin Rash    Past Medical History:  Diagnosis Date   Anal fistula    Anxiety    Asthma    Bipolar disorder    Chronic back pain    fell in tub few yrs ago   Diabetes mellitus without complication    type 2   Hemorrhoids    Hypertension    Obesity    Right tennis elbow 01/03/2018   Seasonal allergies    Vitamin D deficiency     Family History  Problem Relation Age  of Onset   Depression Mother    Anxiety disorder Father    Anxiety disorder Daughter    Sexual abuse Other    Depression Other    Anxiety disorder Other    Paranoid behavior Maternal Grandmother    Bipolar disorder Cousin    Anxiety disorder Cousin     Social History   Socioeconomic History   Marital status: Married    Spouse name: Brad   Number of children: 3   Years of education: 4   Highest education level: 12th grade  Occupational History   Not on file  Tobacco Use   Smoking status: Former    Packs/day: 0.25    Years: 2.00    Additional pack years: 0.00    Total pack years: 0.50    Types: Cigarettes   Smokeless tobacco: Never   Tobacco comments:    quit sept 2018  Vaping Use  Vaping Use: Never used  Substance and Sexual Activity   Alcohol use: No   Drug use: No   Sexual activity: Yes    Partners: Male  Other Topics Concern   Not on file  Social History Narrative   Not on file   Social Determinants of Health   Financial Resource Strain: Not on file  Food Insecurity: Not on file  Transportation Needs: Not on file  Physical Activity: Not on file  Stress: Stress Concern Present (07/11/2018)   [employer] of Occupational Health - Occupational Stress Questionnaire    Feeling of Stress : Very much  Social Connections: Not on file  Intimate Partner Violence: Not on file    Past Medical History, Surgical history, Social history, and Family history were reviewed and updated as appropriate.   Please see review of systems for further details on the patient's review from today.   Objective:   Physical Exam:  BP 128/82   Pulse 91   Wt 274 lb (124.3 kg)   BMI 38.22 kg/m   Physical Exam Neurological:     Mental Status: She is alert and oriented to person, place, and time.     Cranial Nerves: No dysarthria.  Psychiatric:        Attention and Perception: Attention and perception normal.        Mood and Affect: Mood is not anxious or elated.         Speech: Speech normal.        Behavior: Behavior is cooperative.        Thought Content: Thought content normal. Thought content is not paranoid or delusional. Thought content does not include homicidal or suicidal ideation. Thought content does not include homicidal or suicidal plan.        Cognition and Memory: Cognition and memory normal.        Judgment: Judgment normal.     Comments: Insight intact Mood is mildly depressed in response to recent health issues     Lab Review:     Component Value Date/Time   NA 137 06/02/2021 1724   K 3.5 06/02/2021 1724   CL 100 06/02/2021 1724   CO2 27 06/02/2021 1724   GLUCOSE 160 (H) 06/02/2021 1724   BUN 11 06/02/2021 1724   CREATININE 0.64 06/02/2021 1724   CALCIUM 9.3 06/02/2021 1724   PROT 7.7 06/02/2021 1724   ALBUMIN 3.6 06/02/2021 1724   AST 16 06/02/2021 1724   ALT 17 06/02/2021 1724   ALKPHOS 57 06/02/2021 1724   BILITOT 0.5 06/02/2021 1724   GFRNONAA >60 06/02/2021 1724   GFRAA >60 05/24/2020 0836       Component Value Date/Time   WBC 5.8 06/02/2021 1724   RBC 5.24 (H) 06/02/2021 1724   HGB 15.5 (H) 06/02/2021 1724   HCT 46.0 06/02/2021 1724   PLT 256 06/02/2021 1724   MCV 87.8 06/02/2021 1724   MCH 29.6 06/02/2021 1724   MCHC 33.7 06/02/2021 1724   RDW 12.8 06/02/2021 1724   LYMPHSABS 1.6 06/02/2021 1724   MONOABS 0.4 06/02/2021 1724   EOSABS 0.1 06/02/2021 1724   BASOSABS 0.0 06/02/2021 1724    No results found for: "POCLITH", "LITHIUM"   No results found for: "PHENYTOIN", "PHENOBARB", "VALPROATE", "CBMZ"   .res Assessment: Plan:   Will continue current plan of care since target signs and symptoms are well controlled without any tolerability issues. Will change latuda to 40 mg daily in the evening since she reports that she has continued  to take 40 mg dose instead of 60 mg dose and mood is stable at this time.  Continue Vyvanse 40 mg po qd for treatment of ADHD and binge eating disorder.  Continue Citalopram  10 mg po qd and increase to 20 mg po qd one week before start of menses for PMDD and anxiety.  Continue Hydroxyzine 50 mg po QHS for insomnia.  Continue Lamictal 150 mg po qd for mood stabilization.  Continue Trileptal 300 mg po q am and 900 mg po QHS for mood stabilization.  Pt to follow-up in 3 months or sooner if clinically indicated.  Patient advised to contact office with any questions, adverse effects, or acute worsening in signs and symptoms.   Wilbur was seen today for follow-up.  Diagnoses and all orders for this visit:  Generalized anxiety disorder -     busPIRone (BUSPAR) 30 MG tablet; Take 1 tablet (30 mg total) by mouth 2 (two) times daily. -     citalopram (CELEXA) 10 MG tablet; Take 10 mg by mouth daily and then increase to 20 mg daily for one week before menstrual period starts and then go back to 10 mg daily  Bipolar II disorder -     citalopram (CELEXA) 10 MG tablet; Take 10 mg by mouth daily and then increase to 20 mg daily for one week before menstrual period starts and then go back to 10 mg daily -     lamoTRIgine (LAMICTAL) 150 MG tablet; Take 1 tablet (150 mg total) by mouth daily. -     lurasidone (LATUDA) 40 MG TABS tablet; Take 1 tablet (40 mg total) by mouth daily with supper.  Premenstrual dysphoric disorder -     citalopram (CELEXA) 10 MG tablet; Take 10 mg by mouth daily and then increase to 20 mg daily for one week before menstrual period starts and then go back to 10 mg daily -     lurasidone (LATUDA) 40 MG TABS tablet; Take 1 tablet (40 mg total) by mouth daily with supper.  Attention deficit hyperactivity disorder (ADHD), predominantly inattentive type -     lisdexamfetamine (VYVANSE) 40 MG capsule; Take 1 capsule (40 mg total) by mouth every morning. -     lisdexamfetamine (VYVANSE) 40 MG capsule; Take 1 capsule (40 mg total) by mouth every morning.  Binge eating disorder -     lisdexamfetamine (VYVANSE) 40 MG capsule; Take 1 capsule (40 mg total) by  mouth every morning.     Please see After Visit Summary for patient specific instructions.  No future appointments.   No orders of the defined types were placed in this encounter.     -------------------------------

## 2023-04-30 ENCOUNTER — Other Ambulatory Visit: Payer: Self-pay

## 2023-04-30 ENCOUNTER — Telehealth: Payer: Self-pay | Admitting: Physician Assistant

## 2023-04-30 ENCOUNTER — Telehealth: Payer: Self-pay | Admitting: Psychiatry

## 2023-04-30 DIAGNOSIS — F900 Attention-deficit hyperactivity disorder, predominantly inattentive type: Secondary | ICD-10-CM

## 2023-04-30 MED ORDER — LISDEXAMFETAMINE DIMESYLATE 40 MG PO CAPS: 40.0000 mg | ORAL_CAPSULE | ORAL | 0 refills | Status: AC

## 2023-04-30 NOTE — Telephone Encounter (Signed)
Please call to schedule an appt, due this month.  

## 2023-04-30 NOTE — Telephone Encounter (Signed)
[name] called and made appt for 8/7

## 2023-04-30 NOTE — Telephone Encounter (Signed)
Pt lvm that she needs a refill on her vyvanse 40 mg. Pharmacy is [facility] in [place]

## 2023-04-30 NOTE — Telephone Encounter (Signed)
Error

## 2023-04-30 NOTE — Telephone Encounter (Signed)
Pended.

## 2023-05-16 ENCOUNTER — Encounter: Payer: Self-pay | Admitting: Psychiatry

## 2023-05-16 ENCOUNTER — Telehealth (INDEPENDENT_AMBULATORY_CARE_PROVIDER_SITE_OTHER): Payer: 59 | Admitting: Psychiatry

## 2023-05-16 DIAGNOSIS — F411 Generalized anxiety disorder: Secondary | ICD-10-CM | POA: Diagnosis not present

## 2023-05-16 DIAGNOSIS — F5081 Binge eating disorder: Secondary | ICD-10-CM | POA: Diagnosis not present

## 2023-05-16 DIAGNOSIS — F3181 Bipolar II disorder: Secondary | ICD-10-CM

## 2023-05-16 DIAGNOSIS — F3281 Premenstrual dysphoric disorder: Secondary | ICD-10-CM

## 2023-05-16 DIAGNOSIS — F900 Attention-deficit hyperactivity disorder, predominantly inattentive type: Secondary | ICD-10-CM | POA: Diagnosis not present

## 2023-05-16 MED ORDER — LURASIDONE HCL 40 MG PO TABS
40.0000 mg | ORAL_TABLET | Freq: Every day | ORAL | 1 refills | Status: DC
Start: 2023-05-16 — End: 2023-08-09

## 2023-05-16 MED ORDER — LAMOTRIGINE 150 MG PO TABS
150.0000 mg | ORAL_TABLET | Freq: Every day | ORAL | 1 refills | Status: DC
Start: 2023-05-16 — End: 2023-08-09

## 2023-05-16 MED ORDER — OXCARBAZEPINE 600 MG PO TABS
ORAL_TABLET | ORAL | 1 refills | Status: DC
Start: 2023-05-16 — End: 2023-08-09

## 2023-05-16 MED ORDER — LISDEXAMFETAMINE DIMESYLATE 40 MG PO CAPS
40.0000 mg | ORAL_CAPSULE | ORAL | 0 refills | Status: DC
Start: 2023-07-05 — End: 2023-08-09

## 2023-05-16 MED ORDER — CITALOPRAM HYDROBROMIDE 20 MG PO TABS
20.0000 mg | ORAL_TABLET | Freq: Every day | ORAL | 1 refills | Status: DC
Start: 2023-05-16 — End: 2023-08-09

## 2023-05-16 MED ORDER — LISDEXAMFETAMINE DIMESYLATE 40 MG PO CAPS
40.0000 mg | ORAL_CAPSULE | ORAL | 0 refills | Status: DC
Start: 2023-08-02 — End: 2023-08-09

## 2023-05-16 MED ORDER — LISDEXAMFETAMINE DIMESYLATE 40 MG PO CAPS
40.0000 mg | ORAL_CAPSULE | ORAL | 0 refills | Status: DC
Start: 2023-06-07 — End: 2023-08-09

## 2023-05-16 MED ORDER — BUSPIRONE HCL 30 MG PO TABS
30.0000 mg | ORAL_TABLET | Freq: Two times a day (BID) | ORAL | 1 refills | Status: DC
Start: 2023-05-16 — End: 2023-08-09

## 2023-05-16 NOTE — Progress Notes (Signed)
Tanya Parker [id number] Sep 28, 1976 47 y.o.  Virtual Visit via Video Note  I connected with pt @ on 05/16/23 at  9:00 AM EDT by a video enabled telemedicine application and verified that I am speaking with the correct person using two identifiers.   I discussed the limitations of evaluation and management by telemedicine and the availability of in person appointments. The patient expressed understanding and agreed to proceed.  I discussed the assessment and treatment plan with the patient. The patient was provided an opportunity to ask questions and all were answered. The patient agreed with the plan and demonstrated an understanding of the instructions.   The patient was advised to call back or seek an in-person evaluation if the symptoms worsen or if the condition fails to improve as anticipated.  I provided 25 minutes of non-face-to-face time during this encounter.  The patient was located at home.  The provider was located at [facility].   Corie Chiquito, PMHNP   Subjective:   Patient ID:  Tanya Parker is a 47 y.o. (DOB 02/25/1976) female.  Chief Complaint:  Chief Complaint  Patient presents with   Follow-up    Mood disturbance, anxiety, ADHD, and insomnia    HPI Tanya Parker presents for follow-up of Bipolar Disorder, Anxiety, ADHD, and insomnia. She reports, "I have been doing really good." She reports stress at home has improved. She reports that she has been taking higher dose of Citalopram (20 mg) consistently. She feels that higher dose of Citalopram has been helpful for mood and anxiety. She reports that she had a panic attack without apparent trigger. Denies depressed mood. She reports that her sleep is "on and off." Denies any manic s/s to include impulsivity or risky behavior. Denies irritability. She reports that she had to stop Mounjaro due to cost and her appetite has been "up and down." She reports that concentration is "the same" and is  improved with Vyvanse. Denies SI.   Partner has been maintaining sobriety.   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  Vyvanse last filled 05/10/23.  Past medication trials:  Citalopram Trileptal Lamictal-helpful for mood Remeron-effective but caused weight gain Seroquel-excessive somnolence Rexulti-increase glucose, helpful for mood and anxiety Xanax Ambien Trazodone Concerta-helpful Adderall-increased heart rate Ritalin Hydroxyzine BuSpar-effective for anxiety    Review of Systems:  Review of Systems  Gastrointestinal: Negative.   Musculoskeletal:  Negative for gait problem.  Psychiatric/Behavioral:         Please refer to HPI    Medications: I have reviewed the patient's current medications.  Current Outpatient Medications  Medication Sig Dispense Refill   Cyanocobalamin (VITAMIN B 12 PO) Take by mouth.     lisinopril (ZESTRIL) 40 MG tablet Take 40 mg by mouth daily.     metFORMIN (GLUCOPHAGE) 1000 MG tablet Take 1,000 mg by mouth 2 (two) times daily.     busPIRone (BUSPAR) 30 MG tablet Take 1 tablet (30 mg total) by mouth 2 (two) times daily. 180 tablet 1   citalopram (CELEXA) 20 MG tablet Take 1 tablet (20 mg total) by mouth daily. 90 tablet 1   hydrOXYzine (VISTARIL) 50 MG capsule Take 1 capsule (50 mg total) by mouth at bedtime. (Patient not taking: Reported on 01/19/2023) 90 capsule 1   lamoTRIgine (LAMICTAL) 150 MG tablet Take 1 tablet (150 mg total) by mouth daily. 90 tablet 1   lisdexamfetamine (VYVANSE) 40 MG capsule Take  1 capsule (40 mg total) by mouth every morning. 30 capsule 0   [START ON 06/07/2023] lisdexamfetamine (VYVANSE) 40 MG capsule Take 1 capsule (40 mg total) by mouth every morning. 30 capsule 0   [START ON 07/05/2023] lisdexamfetamine (VYVANSE) 40 MG capsule Take 1 capsule (40 mg total) by mouth every morning. 30 capsule 0   [START ON 08/02/2023] lisdexamfetamine  (VYVANSE) 40 MG capsule Take 1 capsule (40 mg total) by mouth every morning. 30 capsule 0   lurasidone (LATUDA) 40 MG TABS tablet Take 1 tablet (40 mg total) by mouth daily with supper. 90 tablet 1   oxcarbazepine (TRILEPTAL) 600 MG tablet Take 0.5 tablets (300 mg total) by mouth in the morning AND 1.5 tablets (900 mg total) at bedtime. 135 tablet 1   VENTOLIN HFA 108 (90 Base) MCG/ACT inhaler Inhale 2 puffs into the lungs every 6 (six) hours as needed for wheezing or shortness of breath.      No current facility-administered medications for this visit.    Medication Side Effects: None  Allergies:  Allergies  Allergen Reactions   Prednisone    Clindamycin/Lincomycin Rash    Past Medical History:  Diagnosis Date   Anal fistula    Anxiety    Asthma    Bipolar disorder (HCC)    Chronic back pain    fell in tub few yrs ago   Diabetes mellitus without complication (HCC)    type 2   Hemorrhoids    Hypertension    Obesity    Right tennis elbow 01/03/2018   Seasonal allergies    Vitamin D deficiency     Family History  Problem Relation Age of Onset   Depression Mother    Anxiety disorder Father    Anxiety disorder Daughter    Sexual abuse Other    Depression Other    Anxiety disorder Other    Paranoid behavior Maternal Grandmother    Bipolar disorder Cousin    Anxiety disorder Cousin     Social History   Socioeconomic History   Marital status: Married    Spouse name: Brad   Number of children: 3   Years of education: 4   Highest education level: 12th grade  Occupational History   Not on file  Tobacco Use   Smoking status: Former    Current packs/day: 0.25    Average packs/day: 0.3 packs/day for 2.0 years (0.5 ttl pk-yrs)    Types: Cigarettes   Smokeless tobacco: Never   Tobacco comments:    quit sept 2018  Vaping Use   Vaping status: Never Used  Substance and Sexual Activity   Alcohol use: No   Drug use: No   Sexual activity: Yes    Partners: Male   Other Topics Concern   Not on file  Social History Narrative   Not on file   Social Determinants of Health   Financial Resource Strain: Not on file  Food Insecurity: Not on file  Transportation Needs: Not on file  Physical Activity: Not on file  Stress: Stress Concern Present (07/11/2018)   [employer] of Occupational Health - Occupational Stress Questionnaire    Feeling of Stress : Very much  Social Connections: Unknown (02/21/2022)   Received from [facility]   Social Network    Social Network: Not on file  Intimate Partner Violence: Unknown (01/12/2022)   Received from [facility]   HITS    Physically Hurt: Not on file    Insult  or Talk Down To: Not on file    Threaten Physical Harm: Not on file    Scream or Curse: Not on file    Past Medical History, Surgical history, Social history, and Family history were reviewed and updated as appropriate.   Please see review of systems for further details on the patient's review from today.   Objective:   Physical Exam:  There were no vitals taken for this visit.  Physical Exam Neurological:     Mental Status: She is alert and oriented to person, place, and time.     Cranial Nerves: No dysarthria.  Psychiatric:        Attention and Perception: Attention and perception normal.        Mood and Affect: Mood normal.        Speech: Speech normal.        Behavior: Behavior is cooperative.        Thought Content: Thought content normal. Thought content is not paranoid or delusional. Thought content does not include homicidal or suicidal ideation. Thought content does not include homicidal or suicidal plan.        Cognition and Memory: Cognition and memory normal.        Judgment: Judgment normal.     Comments: Insight intact     Lab Review:     Component Value Date/Time   NA 137 06/02/2021 1724   K 3.5 06/02/2021 1724   CL 100 06/02/2021 1724   CO2 27 06/02/2021 1724   GLUCOSE 160 (H)  06/02/2021 1724   BUN 11 06/02/2021 1724   CREATININE 0.64 06/02/2021 1724   CALCIUM 9.3 06/02/2021 1724   PROT 7.7 06/02/2021 1724   ALBUMIN 3.6 06/02/2021 1724   AST 16 06/02/2021 1724   ALT 17 06/02/2021 1724   ALKPHOS 57 06/02/2021 1724   BILITOT 0.5 06/02/2021 1724   GFRNONAA >60 06/02/2021 1724   GFRAA >60 05/24/2020 0836       Component Value Date/Time   WBC 5.8 06/02/2021 1724   RBC 5.24 (H) 06/02/2021 1724   HGB 15.5 (H) 06/02/2021 1724   HCT 46.0 06/02/2021 1724   PLT 256 06/02/2021 1724   MCV 87.8 06/02/2021 1724   MCH 29.6 06/02/2021 1724   MCHC 33.7 06/02/2021 1724   RDW 12.8 06/02/2021 1724   LYMPHSABS 1.6 06/02/2021 1724   MONOABS 0.4 06/02/2021 1724   EOSABS 0.1 06/02/2021 1724   BASOSABS 0.0 06/02/2021 1724    No results found for: "POCLITH", "LITHIUM"   No results found for: "PHENYTOIN", "PHENOBARB", "VALPROATE", "CBMZ"   .res Assessment: Plan:   31 minutes spent dedicated to the care of this patient on the date of this encounter to include pre-visit review of records, ordering of medication, post visit documentation, and face-to-face time with the patient discussing response to increase in Citalopram and how her mood and anxiety have improved in response to partner's sobriety. Will increase Citalopram to 20 mg one tablet daily since pt reports that she continued to take two 10 mg tabs after menstrual cycle and reports that this dose seems to be more effective for her mood and anxiety.  Will continue Lamictal 150 mg daily for mood symptoms.  Continue Vyvanse 40 mg daily for ADHD and binge eating disorder.  Continue Buspar 30 mg po BID for anxiety.  Continue Latuda 40 mg daily with supper for mood stabilization.  Continue Trileptal 300 mg in the morning and 900 mg at bedtime for mood stabilization and anxiety.  Pt to follow-up in 3 months or sooner if clinically indicated.  Patient advised to contact office with any questions, adverse effects, or acute  worsening in signs and symptoms.   Tanya Parker was seen today for follow-up.  Diagnoses and all orders for this visit:  Attention deficit hyperactivity disorder (ADHD), predominantly inattentive type -     lisdexamfetamine (VYVANSE) 40 MG capsule; Take 1 capsule (40 mg total) by mouth every morning. -     lisdexamfetamine (VYVANSE) 40 MG capsule; Take 1 capsule (40 mg total) by mouth every morning. -     lisdexamfetamine (VYVANSE) 40 MG capsule; Take 1 capsule (40 mg total) by mouth every morning.  Binge eating disorder -     lisdexamfetamine (VYVANSE) 40 MG capsule; Take 1 capsule (40 mg total) by mouth every morning.  Bipolar II disorder (HCC) -     lamoTRIgine (LAMICTAL) 150 MG tablet; Take 1 tablet (150 mg total) by mouth daily. -     citalopram (CELEXA) 20 MG tablet; Take 1 tablet (20 mg total) by mouth daily. -     lurasidone (LATUDA) 40 MG TABS tablet; Take 1 tablet (40 mg total) by mouth daily with supper. -     oxcarbazepine (TRILEPTAL) 600 MG tablet; Take 0.5 tablets (300 mg total) by mouth in the morning AND 1.5 tablets (900 mg total) at bedtime.  Generalized anxiety disorder -     busPIRone (BUSPAR) 30 MG tablet; Take 1 tablet (30 mg total) by mouth 2 (two) times daily. -     citalopram (CELEXA) 20 MG tablet; Take 1 tablet (20 mg total) by mouth daily.  Premenstrual dysphoric disorder -     citalopram (CELEXA) 20 MG tablet; Take 1 tablet (20 mg total) by mouth daily. -     lurasidone (LATUDA) 40 MG TABS tablet; Take 1 tablet (40 mg total) by mouth daily with supper.     Please see After Visit Summary for patient specific instructions.  No future appointments.  No orders of the defined types were placed in this encounter.     -------------------------------

## 2023-08-08 ENCOUNTER — Telehealth: Payer: Self-pay | Admitting: Psychiatry

## 2023-08-08 NOTE — Telephone Encounter (Signed)
Next appt is 08/09/23. Requesting refill on her Vyvanse called to:  [facility] [address] [place] - [address]   Phone: [phone]  Fax: [phone]    It is in stock per patient.

## 2023-08-08 NOTE — Telephone Encounter (Signed)
Pt has an Rx available at the pharmacy, notified her.

## 2023-08-09 ENCOUNTER — Encounter: Payer: Self-pay | Admitting: Psychiatry

## 2023-08-09 ENCOUNTER — Telehealth (INDEPENDENT_AMBULATORY_CARE_PROVIDER_SITE_OTHER): Payer: BC Managed Care – PPO | Admitting: Psychiatry

## 2023-08-09 DIAGNOSIS — G4700 Insomnia, unspecified: Secondary | ICD-10-CM

## 2023-08-09 DIAGNOSIS — F50819 Binge eating disorder, unspecified: Secondary | ICD-10-CM

## 2023-08-09 DIAGNOSIS — F3181 Bipolar II disorder: Secondary | ICD-10-CM | POA: Diagnosis not present

## 2023-08-09 DIAGNOSIS — F411 Generalized anxiety disorder: Secondary | ICD-10-CM

## 2023-08-09 DIAGNOSIS — F3281 Premenstrual dysphoric disorder: Secondary | ICD-10-CM

## 2023-08-09 DIAGNOSIS — F900 Attention-deficit hyperactivity disorder, predominantly inattentive type: Secondary | ICD-10-CM

## 2023-08-09 MED ORDER — BUSPIRONE HCL 30 MG PO TABS: 30.0000 mg | ORAL_TABLET | Freq: Two times a day (BID) | ORAL | 1 refills | Status: AC

## 2023-08-09 MED ORDER — OXCARBAZEPINE 600 MG PO TABS
ORAL_TABLET | ORAL | 1 refills | Status: AC
Start: 2023-08-09 — End: 2023-11-07

## 2023-08-09 MED ORDER — LAMOTRIGINE 150 MG PO TABS: 150.0000 mg | ORAL_TABLET | Freq: Every day | ORAL | 1 refills | Status: AC

## 2023-08-09 MED ORDER — LISDEXAMFETAMINE DIMESYLATE 40 MG PO CAPS
40.0000 mg | ORAL_CAPSULE | ORAL | 0 refills | Status: AC
Start: 2023-11-01 — End: 2023-12-01

## 2023-08-09 MED ORDER — TRAZODONE HCL 100 MG PO TABS
ORAL_TABLET | ORAL | 2 refills | Status: AC
Start: 2023-08-09 — End: ?

## 2023-08-09 MED ORDER — LISDEXAMFETAMINE DIMESYLATE 40 MG PO CAPS
40.0000 mg | ORAL_CAPSULE | ORAL | 0 refills | Status: AC
Start: 2023-10-04 — End: 2023-11-03

## 2023-08-09 MED ORDER — LURASIDONE HCL 40 MG PO TABS: 40.0000 mg | ORAL_TABLET | Freq: Every day | ORAL | 1 refills | Status: AC

## 2023-08-09 MED ORDER — CITALOPRAM HYDROBROMIDE 20 MG PO TABS: 20.0000 mg | ORAL_TABLET | Freq: Every day | ORAL | 1 refills | Status: AC

## 2023-08-09 MED ORDER — LISDEXAMFETAMINE DIMESYLATE 40 MG PO CAPS
40.0000 mg | ORAL_CAPSULE | ORAL | 0 refills | Status: AC
Start: 2023-09-06 — End: ?

## 2023-08-09 NOTE — Progress Notes (Signed)
Tanya Parker [id number] 23-Mar-1976 47 y.o.  Virtual Visit via Video Note  I connected with pt @ on 08/09/23 at  2:00 PM EDT by a video enabled telemedicine application and verified that I am speaking with the correct person using two identifiers.   I discussed the limitations of evaluation and management by telemedicine and the availability of in person appointments. The patient expressed understanding and agreed to proceed.  I discussed the assessment and treatment plan with the patient. The patient was provided an opportunity to ask questions and all were answered. The patient agreed with the plan and demonstrated an understanding of the instructions.   The patient was advised to call back or seek an in-person evaluation if the symptoms worsen or if the condition fails to improve as anticipated.  I provided 31 minutes of non-face-to-face time during this encounter.  The patient was located in [place].  The provider was located at [facility].   Corie Chiquito, PMHNP   Subjective:   Patient ID:  Tanya Parker is a 47 y.o. (DOB [birth year]) female.  Chief Complaint:  Chief Complaint  Patient presents with   Insomnia   Manic Behavior    HPI [name] presents for follow-up of mood disturbance, anxiety, insomnia, and ADHD. She reports poor sleep and sleeping 3-4 hours. She reports that she stayed awake until 5 am recently and was thinking about different tasks she can accomplish, like cleaning her patio or cleaning out her closet. Thoughts that are keeping her awake are also anxious in content. She reports feeling "exhausted." Sleepy during the day. She reports that she has noticed these symptoms over the last month. She reports improved mood with increase in Citalopram. She noticed one day last week she was very irritated with someone at work who was let go. Denies impulsive or risky behaviors. Denies excessive spending.   She reports that anxiety is "normal"  during the day and increased at night. She reports that on a couple of occasions she has noticed chest tightness with anxiety. She reports chronic difficulty with concentration and that Vyvanse helps with concentration. She reports weight gain since not taking Mounjaro. Denies SI.   She reports PMDD symptoms have been "alright." Notices some slight sadness at times around that time.   Vyvanse last filled 08/09/23.   Past medication trials:  Citalopram Trileptal Lamictal-helpful for mood Remeron-effective but caused weight gain Seroquel-excessive somnolence Rexulti-increase glucose, helpful for mood and anxiety Latuda Xanax Ambien Trazodone Concerta-helpful Adderall-increased heart rate Ritalin Hydroxyzine BuSpar-effective for anxiety  Review of Systems:  Review of Systems  Musculoskeletal:  Negative for gait problem.  Neurological:  Negative for tremors.  Psychiatric/Behavioral:         Please refer to HPI    Medications: I have reviewed the patient's current medications.  Current Outpatient Medications  Medication Sig Dispense Refill   traZODone (DESYREL) 100 MG tablet Take 1/2-1 tablet po QHS prn insomnia 30 tablet 2   busPIRone (BUSPAR) 30 MG tablet Take 1 tablet (30 mg total) by mouth 2 (two) times daily. 180 tablet 1   citalopram (CELEXA) 20 MG tablet Take 1 tablet (20 mg total) by mouth daily. 90 tablet 1   Cyanocobalamin (VITAMIN B 12 PO) Take by mouth.     hydrOXYzine (VISTARIL) 50 MG capsule Take 1 capsule (50 mg total) by mouth at bedtime. (Patient not taking: Reported on 01/19/2023) 90 capsule 1   lamoTRIgine (LAMICTAL) 150 MG tablet Take 1 tablet (150 mg total) by  mouth daily. 90 tablet 1   lisdexamfetamine (VYVANSE) 40 MG capsule Take 1 capsule (40 mg total) by mouth every morning. 30 capsule 0   [START ON 09/06/2023] lisdexamfetamine (VYVANSE) 40 MG capsule Take 1 capsule (40 mg total) by mouth every morning. 30 capsule 0   [START ON 10/04/2023]  lisdexamfetamine (VYVANSE) 40 MG capsule Take 1 capsule (40 mg total) by mouth every morning. 30 capsule 0   [START ON 11/01/2023] lisdexamfetamine (VYVANSE) 40 MG capsule Take 1 capsule (40 mg total) by mouth every morning. 30 capsule 0   lisinopril (ZESTRIL) 40 MG tablet Take 40 mg by mouth daily.     lurasidone (LATUDA) 40 MG TABS tablet Take 1 tablet (40 mg total) by mouth daily with supper. 90 tablet 1   metFORMIN (GLUCOPHAGE) 1000 MG tablet Take 1,000 mg by mouth 2 (two) times daily.     oxcarbazepine (TRILEPTAL) 600 MG tablet Take 0.5 tablets (300 mg total) by mouth in the morning AND 1.5 tablets (900 mg total) at bedtime. 135 tablet 1   VENTOLIN HFA 108 (90 Base) MCG/ACT inhaler Inhale 2 puffs into the lungs every 6 (six) hours as needed for wheezing or shortness of breath.      No current facility-administered medications for this visit.    Medication Side Effects: None  Allergies:  Allergies  Allergen Reactions   Prednisone    Clindamycin/Lincomycin Rash    Past Medical History:  Diagnosis Date   Anal fistula    Anxiety    Asthma    Bipolar disorder (HCC)    Chronic back pain    fell in tub few yrs ago   Diabetes mellitus without complication (HCC)    type 2   Hemorrhoids    Hypertension    Obesity    Right tennis elbow 01/03/2018   Seasonal allergies    Vitamin D deficiency     Family History  Problem Relation Age of Onset   Depression Mother    Anxiety disorder Father    Anxiety disorder Daughter    Sexual abuse Other    Depression Other    Anxiety disorder Other    Paranoid behavior Maternal Grandmother    Bipolar disorder Cousin    Anxiety disorder Cousin     Social History   Socioeconomic History   Marital status: Married    Spouse name: Brad   Number of children: 3   Years of education: 4   Highest education level: 12th grade  Occupational History   Not on file  Tobacco Use   Smoking status: Former    Current packs/day: 0.25    Average  packs/day: 0.3 packs/day for 2.0 years (0.5 ttl pk-yrs)    Types: Cigarettes   Smokeless tobacco: Never   Tobacco comments:    quit sept 2018  Vaping Use   Vaping status: Never Used  Substance and Sexual Activity   Alcohol use: No   Drug use: No   Sexual activity: Yes    Partners: Male  Other Topics Concern   Not on file  Social History Narrative   Not on file   Social Determinants of Health   Financial Resource Strain: Not on file  Food Insecurity: Not on file  Transportation Needs: Not on file  Physical Activity: Not on file  Stress: Stress Concern Present (07/11/2018)   [employer] of Occupational Health - Occupational Stress Questionnaire    Feeling of Stress : Very much  Social Connections: Unknown (02/21/2022)   Received  from [facility], [facility]   Social Network    Social Network: Not on file  Intimate Partner Violence: Unknown (01/12/2022)   Received from [facility], [facility]   HITS    Physically Hurt: Not on file    Insult or Talk Down To: Not on file    Threaten Physical Harm: Not on file    Scream or Curse: Not on file    Past Medical History, Surgical history, Social history, and Family history were reviewed and updated as appropriate.   Please see review of systems for further details on the patient's review from today.   Objective:   Physical Exam:  There were no vitals taken for this visit.  Physical Exam Neurological:     Mental Status: She is alert and oriented to person, place, and time.     Cranial Nerves: No dysarthria.  Psychiatric:        Attention and Perception: Attention and perception normal.        Mood and Affect: Mood normal.        Speech: Speech normal.        Behavior: Behavior is cooperative.        Thought Content: Thought content normal. Thought content is not paranoid or delusional. Thought content does not include homicidal or suicidal ideation. Thought content does not include homicidal or suicidal plan.         Cognition and Memory: Cognition and memory normal.        Judgment: Judgment normal.     Comments: Insight intact     Lab Review:     Component Value Date/Time   NA 137 06/02/2021 1724   K 3.5 06/02/2021 1724   CL 100 06/02/2021 1724   CO2 27 06/02/2021 1724   GLUCOSE 160 (H) 06/02/2021 1724   BUN 11 06/02/2021 1724   CREATININE 0.64 06/02/2021 1724   CALCIUM 9.3 06/02/2021 1724   PROT 7.7 06/02/2021 1724   ALBUMIN 3.6 06/02/2021 1724   AST 16 06/02/2021 1724   ALT 17 06/02/2021 1724   ALKPHOS 57 06/02/2021 1724   BILITOT 0.5 06/02/2021 1724   GFRNONAA >60 06/02/2021 1724   GFRAA >60 05/24/2020 0836       Component Value Date/Time   WBC 5.8 06/02/2021 1724   RBC 5.24 (H) 06/02/2021 1724   HGB 15.5 (H) 06/02/2021 1724   HCT 46.0 06/02/2021 1724   PLT 256 06/02/2021 1724   MCV 87.8 06/02/2021 1724   MCH 29.6 06/02/2021 1724   MCHC 33.7 06/02/2021 1724   RDW 12.8 06/02/2021 1724   LYMPHSABS 1.6 06/02/2021 1724   MONOABS 0.4 06/02/2021 1724   EOSABS 0.1 06/02/2021 1724   BASOSABS 0.0 06/02/2021 1724    No results found for: "POCLITH", "LITHIUM"   No results found for: "PHENYTOIN", "PHENOBARB", "VALPROATE", "CBMZ"   .res Assessment: Plan:    37 minutes spent dedicated to the care of this patient on the date of this encounter to include pre-visit review of records, ordering of medication, post visit documentation, and face-to-face time with the patient discussing recent insomnia and that this may be an indication of hypomania. Discussed treatment options for insomnia. Discussed potential benefits, risks, and side effects of Trazodone. Pt agrees to trial of Trazodone. Will start Trazodone 100 mg 1/2-1 tablet at bedtime as needed for insomnia.  Discussed considering an increase in Latuda if insomnia does not improve with starting Trazodone or if mood symptoms worsen. Will continue Latuda 40 mg daily with  supper for mood stabilization.  Continue Buspar 30 mg twice  daily for anxiety.  Continue Celexa 20 mg daily for anxiety and depression.  Continue Lamictal 150 mg daily for mood stabilization.  Continue Vyvanse 40 mg daily for ADHD.  Continue Trileptal 300 mg in the morning and 900 mg at bedtime for mood stabilization.  Patient advised to contact office with any questions, adverse effects, or acute worsening in signs and symptoms.   Dijon was seen today for insomnia and manic behavior.  Diagnoses and all orders for this visit:  Bipolar II disorder (HCC) -     citalopram (CELEXA) 20 MG tablet; Take 1 tablet (20 mg total) by mouth daily. -     lamoTRIgine (LAMICTAL) 150 MG tablet; Take 1 tablet (150 mg total) by mouth daily. -     lurasidone (LATUDA) 40 MG TABS tablet; Take 1 tablet (40 mg total) by mouth daily with supper. -     oxcarbazepine (TRILEPTAL) 600 MG tablet; Take 0.5 tablets (300 mg total) by mouth in the morning AND 1.5 tablets (900 mg total) at bedtime.  Insomnia, unspecified type -     traZODone (DESYREL) 100 MG tablet; Take 1/2-1 tablet po QHS prn insomnia  Attention deficit hyperactivity disorder (ADHD), predominantly inattentive type -     lisdexamfetamine (VYVANSE) 40 MG capsule; Take 1 capsule (40 mg total) by mouth every morning. -     lisdexamfetamine (VYVANSE) 40 MG capsule; Take 1 capsule (40 mg total) by mouth every morning. -     lisdexamfetamine (VYVANSE) 40 MG capsule; Take 1 capsule (40 mg total) by mouth every morning.  Binge eating disorder -     lisdexamfetamine (VYVANSE) 40 MG capsule; Take 1 capsule (40 mg total) by mouth every morning.  Generalized anxiety disorder -     busPIRone (BUSPAR) 30 MG tablet; Take 1 tablet (30 mg total) by mouth 2 (two) times daily. -     citalopram (CELEXA) 20 MG tablet; Take 1 tablet (20 mg total) by mouth daily.  Premenstrual dysphoric disorder -     citalopram (CELEXA) 20 MG tablet; Take 1 tablet (20 mg total) by mouth daily. -     lurasidone (LATUDA) 40 MG TABS tablet; Take  1 tablet (40 mg total) by mouth daily with supper.     Please see After Visit Summary for patient specific instructions.  No future appointments.   No orders of the defined types were placed in this encounter.     -------------------------------

## 2023-08-22 ENCOUNTER — Encounter: Payer: Self-pay | Admitting: Psychiatry

## 2023-10-22 ENCOUNTER — Telehealth: Payer: Self-pay

## 2023-10-22 NOTE — Telephone Encounter (Addendum)
 Prior Authorization Lisdexamfetamine Dimesylate 40MG  capsules #30/30 [employer]  Approved Effective:  10/23/2023 to 10/22/2024
